# Patient Record
Sex: Female | Born: 1935 | Race: White | Hispanic: No | Smoking: Never smoker
Health system: Southern US, Community
[De-identification: ages and names within clinical notes are randomized; demographics above are authoritative.]

## PROBLEM LIST (undated history)

## (undated) DIAGNOSIS — E7800 Pure hypercholesterolemia, unspecified: Secondary | ICD-10-CM

## (undated) DIAGNOSIS — J449 Chronic obstructive pulmonary disease, unspecified: Secondary | ICD-10-CM

## (undated) DIAGNOSIS — I10 Essential (primary) hypertension: Secondary | ICD-10-CM

## (undated) DIAGNOSIS — E079 Disorder of thyroid, unspecified: Secondary | ICD-10-CM

## (undated) HISTORY — PX: DILATION AND CURETTAGE OF UTERUS: SHX78

## (undated) HISTORY — PX: ABDOMINAL HYSTERECTOMY: SHX81

## (undated) HISTORY — PX: OTHER SURGICAL HISTORY: SHX169

---

## 2001-09-08 ENCOUNTER — Ambulatory Visit (HOSPITAL_COMMUNITY): Admission: RE | Admit: 2001-09-08 | Discharge: 2001-09-08 | Payer: Self-pay | Admitting: Obstetrics and Gynecology

## 2001-09-08 ENCOUNTER — Encounter: Payer: Self-pay | Admitting: Obstetrics and Gynecology

## 2001-09-14 ENCOUNTER — Other Ambulatory Visit: Admission: RE | Admit: 2001-09-14 | Discharge: 2001-09-14 | Payer: Self-pay | Admitting: Obstetrics and Gynecology

## 2001-09-18 ENCOUNTER — Encounter: Payer: Self-pay | Admitting: Obstetrics and Gynecology

## 2001-09-18 ENCOUNTER — Ambulatory Visit (HOSPITAL_COMMUNITY): Admission: RE | Admit: 2001-09-18 | Discharge: 2001-09-18 | Payer: Self-pay | Admitting: Obstetrics and Gynecology

## 2002-11-16 ENCOUNTER — Ambulatory Visit (HOSPITAL_COMMUNITY): Admission: RE | Admit: 2002-11-16 | Discharge: 2002-11-16 | Payer: Self-pay | Admitting: Obstetrics and Gynecology

## 2002-11-16 ENCOUNTER — Encounter: Payer: Self-pay | Admitting: Obstetrics and Gynecology

## 2003-12-06 ENCOUNTER — Ambulatory Visit (HOSPITAL_COMMUNITY): Admission: RE | Admit: 2003-12-06 | Discharge: 2003-12-06 | Payer: Self-pay | Admitting: Family Medicine

## 2004-07-31 ENCOUNTER — Ambulatory Visit (HOSPITAL_COMMUNITY): Admission: RE | Admit: 2004-07-31 | Discharge: 2004-07-31 | Payer: Self-pay | Admitting: Obstetrics and Gynecology

## 2004-08-01 ENCOUNTER — Emergency Department (HOSPITAL_COMMUNITY): Admission: EM | Admit: 2004-08-01 | Discharge: 2004-08-01 | Payer: Self-pay | Admitting: Emergency Medicine

## 2004-08-06 ENCOUNTER — Ambulatory Visit (HOSPITAL_COMMUNITY): Admission: RE | Admit: 2004-08-06 | Discharge: 2004-08-06 | Payer: Self-pay | Admitting: Family Medicine

## 2006-03-06 ENCOUNTER — Ambulatory Visit (HOSPITAL_COMMUNITY): Admission: RE | Admit: 2006-03-06 | Discharge: 2006-03-06 | Payer: Self-pay | Admitting: Obstetrics and Gynecology

## 2006-09-15 ENCOUNTER — Ambulatory Visit (HOSPITAL_COMMUNITY): Admission: RE | Admit: 2006-09-15 | Discharge: 2006-09-15 | Payer: Self-pay | Admitting: Urology

## 2006-09-25 ENCOUNTER — Ambulatory Visit (HOSPITAL_COMMUNITY): Admission: RE | Admit: 2006-09-25 | Discharge: 2006-09-25 | Payer: Self-pay | Admitting: Urology

## 2006-10-09 ENCOUNTER — Ambulatory Visit (HOSPITAL_COMMUNITY): Admission: RE | Admit: 2006-10-09 | Discharge: 2006-10-09 | Payer: Self-pay | Admitting: Urology

## 2006-10-13 ENCOUNTER — Ambulatory Visit (HOSPITAL_COMMUNITY): Admission: RE | Admit: 2006-10-13 | Discharge: 2006-10-13 | Payer: Self-pay | Admitting: Urology

## 2006-10-17 ENCOUNTER — Ambulatory Visit (HOSPITAL_COMMUNITY): Admission: RE | Admit: 2006-10-17 | Discharge: 2006-10-17 | Payer: Self-pay | Admitting: Urology

## 2006-10-27 ENCOUNTER — Ambulatory Visit (HOSPITAL_COMMUNITY): Admission: RE | Admit: 2006-10-27 | Discharge: 2006-10-27 | Payer: Self-pay | Admitting: Urology

## 2006-11-22 ENCOUNTER — Emergency Department (HOSPITAL_COMMUNITY): Admission: EM | Admit: 2006-11-22 | Discharge: 2006-11-22 | Payer: Self-pay | Admitting: Emergency Medicine

## 2006-11-30 ENCOUNTER — Emergency Department (HOSPITAL_COMMUNITY): Admission: EM | Admit: 2006-11-30 | Discharge: 2006-12-01 | Payer: Self-pay | Admitting: Emergency Medicine

## 2006-12-03 ENCOUNTER — Ambulatory Visit (HOSPITAL_COMMUNITY): Admission: RE | Admit: 2006-12-03 | Discharge: 2006-12-03 | Payer: Self-pay | Admitting: Urology

## 2006-12-09 ENCOUNTER — Ambulatory Visit (HOSPITAL_COMMUNITY): Admission: RE | Admit: 2006-12-09 | Discharge: 2006-12-09 | Payer: Self-pay | Admitting: Urology

## 2007-01-05 ENCOUNTER — Inpatient Hospital Stay (HOSPITAL_COMMUNITY): Admission: RE | Admit: 2007-01-05 | Discharge: 2007-01-08 | Payer: Self-pay | Admitting: Urology

## 2007-01-05 ENCOUNTER — Encounter (INDEPENDENT_AMBULATORY_CARE_PROVIDER_SITE_OTHER): Payer: Self-pay | Admitting: Specialist

## 2007-04-07 ENCOUNTER — Ambulatory Visit (HOSPITAL_COMMUNITY): Admission: RE | Admit: 2007-04-07 | Discharge: 2007-04-07 | Payer: Self-pay | Admitting: Urology

## 2007-05-13 ENCOUNTER — Ambulatory Visit (HOSPITAL_COMMUNITY): Admission: RE | Admit: 2007-05-13 | Discharge: 2007-05-13 | Payer: Self-pay | Admitting: Obstetrics and Gynecology

## 2008-02-12 ENCOUNTER — Ambulatory Visit (HOSPITAL_COMMUNITY): Admission: RE | Admit: 2008-02-12 | Discharge: 2008-02-12 | Payer: Self-pay | Admitting: Urology

## 2008-05-30 ENCOUNTER — Ambulatory Visit (HOSPITAL_COMMUNITY): Admission: RE | Admit: 2008-05-30 | Discharge: 2008-05-30 | Payer: Self-pay | Admitting: Obstetrics and Gynecology

## 2008-09-05 ENCOUNTER — Ambulatory Visit (HOSPITAL_COMMUNITY): Admission: RE | Admit: 2008-09-05 | Discharge: 2008-09-05 | Payer: Self-pay | Admitting: Urology

## 2008-09-15 ENCOUNTER — Ambulatory Visit (HOSPITAL_COMMUNITY): Admission: RE | Admit: 2008-09-15 | Discharge: 2008-09-15 | Payer: Self-pay | Admitting: Urology

## 2009-02-03 ENCOUNTER — Ambulatory Visit (HOSPITAL_COMMUNITY): Admission: RE | Admit: 2009-02-03 | Discharge: 2009-02-03 | Payer: Self-pay | Admitting: Urology

## 2009-07-03 ENCOUNTER — Ambulatory Visit (HOSPITAL_COMMUNITY): Admission: RE | Admit: 2009-07-03 | Discharge: 2009-07-03 | Payer: Self-pay | Admitting: Obstetrics and Gynecology

## 2010-02-02 ENCOUNTER — Ambulatory Visit (HOSPITAL_COMMUNITY): Admission: RE | Admit: 2010-02-02 | Discharge: 2010-02-02 | Payer: Self-pay | Admitting: Urology

## 2010-07-09 ENCOUNTER — Ambulatory Visit (HOSPITAL_COMMUNITY): Admission: RE | Admit: 2010-07-09 | Discharge: 2010-07-09 | Payer: Self-pay | Admitting: Obstetrics and Gynecology

## 2010-10-21 ENCOUNTER — Encounter: Payer: Self-pay | Admitting: Urology

## 2011-01-01 ENCOUNTER — Other Ambulatory Visit (HOSPITAL_COMMUNITY): Payer: Self-pay | Admitting: Internal Medicine

## 2011-01-01 DIAGNOSIS — K8020 Calculus of gallbladder without cholecystitis without obstruction: Secondary | ICD-10-CM

## 2011-01-01 DIAGNOSIS — R1011 Right upper quadrant pain: Secondary | ICD-10-CM

## 2011-01-03 ENCOUNTER — Ambulatory Visit (HOSPITAL_COMMUNITY)
Admission: RE | Admit: 2011-01-03 | Discharge: 2011-01-03 | Disposition: A | Discharge status: Home / Self Care | Payer: Medicare Other | Source: Ambulatory Visit | Attending: Internal Medicine | Admitting: Internal Medicine

## 2011-01-03 DIAGNOSIS — K838 Other specified diseases of biliary tract: Secondary | ICD-10-CM | POA: Insufficient documentation

## 2011-01-03 DIAGNOSIS — K8020 Calculus of gallbladder without cholecystitis without obstruction: Secondary | ICD-10-CM

## 2011-01-03 DIAGNOSIS — R1011 Right upper quadrant pain: Secondary | ICD-10-CM

## 2011-01-04 ENCOUNTER — Ambulatory Visit (HOSPITAL_COMMUNITY): Payer: Medicare Other

## 2011-01-15 ENCOUNTER — Other Ambulatory Visit (HOSPITAL_COMMUNITY): Payer: Self-pay | Admitting: Internal Medicine

## 2011-01-15 ENCOUNTER — Ambulatory Visit (HOSPITAL_COMMUNITY)
Admission: RE | Admit: 2011-01-15 | Discharge: 2011-01-15 | Disposition: A | Discharge status: Home / Self Care | Payer: Medicare Other | Source: Ambulatory Visit | Attending: Internal Medicine | Admitting: Internal Medicine

## 2011-01-15 DIAGNOSIS — R0781 Pleurodynia: Secondary | ICD-10-CM

## 2011-01-15 DIAGNOSIS — R05 Cough: Secondary | ICD-10-CM

## 2011-01-15 DIAGNOSIS — R61 Generalized hyperhidrosis: Secondary | ICD-10-CM

## 2011-01-15 DIAGNOSIS — R059 Cough, unspecified: Secondary | ICD-10-CM | POA: Insufficient documentation

## 2011-01-15 DIAGNOSIS — R0789 Other chest pain: Secondary | ICD-10-CM | POA: Insufficient documentation

## 2011-01-18 ENCOUNTER — Other Ambulatory Visit (HOSPITAL_COMMUNITY): Payer: Self-pay | Admitting: Internal Medicine

## 2011-01-22 ENCOUNTER — Ambulatory Visit (HOSPITAL_COMMUNITY): Admission: RE | Admit: 2011-01-22 | Payer: Medicare Other | Source: Ambulatory Visit

## 2011-01-24 ENCOUNTER — Ambulatory Visit (HOSPITAL_COMMUNITY)
Admission: RE | Admit: 2011-01-24 | Discharge: 2011-01-24 | Disposition: A | Discharge status: Home / Self Care | Payer: Medicare Other | Source: Ambulatory Visit | Attending: Internal Medicine | Admitting: Internal Medicine

## 2011-01-24 ENCOUNTER — Encounter (HOSPITAL_COMMUNITY): Payer: Self-pay

## 2011-01-24 DIAGNOSIS — R0789 Other chest pain: Secondary | ICD-10-CM | POA: Insufficient documentation

## 2011-01-24 DIAGNOSIS — R918 Other nonspecific abnormal finding of lung field: Secondary | ICD-10-CM | POA: Insufficient documentation

## 2011-01-24 DIAGNOSIS — I10 Essential (primary) hypertension: Secondary | ICD-10-CM | POA: Insufficient documentation

## 2011-01-24 DIAGNOSIS — R059 Cough, unspecified: Secondary | ICD-10-CM | POA: Insufficient documentation

## 2011-01-24 DIAGNOSIS — R05 Cough: Secondary | ICD-10-CM | POA: Insufficient documentation

## 2011-01-24 HISTORY — DX: Essential (primary) hypertension: I10

## 2011-02-15 NOTE — Discharge Summary (Signed)
[name], FAIR            ACCOUNT NO.:  [id number]   MEDICAL RECORD NO.:  [id number]          PATIENT TYPE:  INP   LOCATION:  1442                         FACILITY:  [facility]   PHYSICIAN:  Heloise Purpura, MD      DATE OF BIRTH:  11-17-1930   DATE OF ADMISSION:  01/05/2007  DATE OF DISCHARGE:  01/08/2007                               DISCHARGE SUMMARY   ADMISSION DIAGNOSIS:  Right ureteropelvic junction obstruction.   DISCHARGE DIAGNOSIS:  Right ureteropelvic junction obstruction.   PROCEDURES:  1. Cystoscopy.  2. Right ureteral stent placement.  3. Right robotic assisted laparoscopic dismembered pyeloplasty.   HISTORY AND PHYSICAL:  For full details, please see admission history  and physical.  Briefly, [name] is a 77 year old female who was  found to have right renal calculi and a right ureteropelvic junction  obstruction.  She was found to have preserved renal function and after  discussing management options for this problem elected to proceed with  the above procedure.   HOSPITAL COURSE:  On 01/05/2007, the patient was taken to the operating  room and underwent the aforementioned procedures.  She tolerated her  surgery well and without complications.  Postoperatively, she was able  to be transferred to a regular hospital room following recovery from  anesthesia.  She was closely monitored and remained hemodynamically  stable.  Her hemoglobin did drop to 11.0 on postoperative day #1 which  was most likely due to IV fluid hydration and hemodilution.  Her  hemoglobin was rechecked and was stable at 10.6.  She was able to begin  a clear liquid diet on postoperative day #1 was able to begin ambulating  which she did without difficulty.  She was gradually transitioned to  oral pain medication and her diet was advanced to a regular diet by the  afternoon of postoperative day #2.  On the morning of postoperative day  #3, her drain fluid was checked for creatinine level as  the output had  remained sufficiently low.  This was found to be 0.6 and consistent with  serum.  Drain was therefore removed and she was able to be discharged  home in excellent condition.   DISPOSITION:  Home.   DISCHARGE MEDICATIONS:  The patient was instructed to use Darvocet as  needed for pain and to use Colace as a stool softener.  She was given  instructions also to begin doxycycline shortly following discharge as  her urine culture from her nephrostomy tube did return and was sensitive  to this antibiotic therapy.   DISCHARGE INSTRUCTIONS:  The patient was instructed to be ambulatory but  specifically told to refrain from any heavy lifting, strenuous activity,  or driving.   FOLLOW UP:  The patient was scheduled to follow-up in 2 weeks for  further evaluation and removal of her staples.           ______________________________  Heloise Purpura, MD  Electronically Signed     LB/MEDQ  D:  01/08/2007  T:  01/09/2007  Job:  [id number]

## 2011-02-15 NOTE — Op Note (Signed)
[name], [name]            ACCOUNT NO.:  [id number]   MEDICAL RECORD NO.:  [id number]          PATIENT TYPE:  AMB   LOCATION:  DAY                           FACILITY:  [facility]   PHYSICIAN:  Ky Barban, M.D.DATE OF BIRTH:  May 20, 1931   DATE OF PROCEDURE:  10/17/2006  DATE OF DISCHARGE:                               OPERATIVE REPORT   PREOPERATIVE DIAGNOSIS:  Right staghorn calculus.   POSTOPERATIVE DIAGNOSIS:  Right staghorn calculus.   PROCEDURE:  The patient is under general endotracheal anesthesia in  prone position.  A nephrostogram is done and she has a rather very large  renal pelvis.  I did not see the stone inside the pelvis because higher  up, but this part of the pelvis was not communicating with the upper  pole and the middle pole.  Decided to look into this area and the  patient already has a nephrostomy tube and a guidewire is passed into  the nephrostomy tube and the guidewire was coiled up into the renal  pelvis.  Over the guidewire a balloon dilator was inserted and the  nephrostomy track is dilated.  After waiting 4 minutes, I introduced the  #30 __________  tube over the balloon.  It is positioned within the  renal pelvis and the skin.  The tube is coming a about a couple of  inches outside and this __________  tube once it is in place, the  balloon is removed.  I already had a second safety guidewire which was  stitched to the skin.  Introduced a nephroscope and looked into the huge  cavity in the renal pelvis.  I do not see any staghorn stone in this  area.  Under fluoroscopy I can see the stone is higher up.  I cannot get  to that area through this nephrostomy track, and there was one stone  floating around, which I removed.  At this point I decided that I would  __________  this procedure as the patient needs to have a proper  placement of the nephrostomy tube and during this process, I was able to  locate the UPJ and the guidewire was passed  toward the bladder but I  decided not to do endopyelotomy at this stage.  So the guidewire was  removed and through the nephrostomy __________  tube I was able to put a  #14 Foley catheter.  The balloon is inflated with 5 mL, it is draining  fine, and at this point the __________  tube was cut and removed from  around the Foley catheter.  The guidewire was removed.  The patient is  not having any bleeding.  She did not lose any blood during the  procedure, maybe 5 or 10 mL whole amount, and skin was closed, which I  had opened before I opened the balloon, so it was closed with one stitch  of silk.  A sterile dressing applied.  The patient left the operating  room in satisfactory condition.  I want to mention that this Foley  catheter, I have cut the tip in case we need to get into  the renal  pelvis, the guidewire can be introduced through that.  The patient left  the operating room in satisfactory condition.      Ky Barban, M.D.  Electronically Signed     MIJ/MEDQ  D:  10/17/2006  T:  10/17/2006  Job:  [id number]

## 2011-02-15 NOTE — Op Note (Signed)
[name], Cabrera            ACCOUNT NO.:  [id number]   MEDICAL RECORD NO.:  [id number]          PATIENT TYPE:  AMB   LOCATION:  DAY                          [facility]ACILITY:  [facility]   PHYSICIAN:  Heloise Purpura, MD      DATE OF BIRTH:  1931-06-11   DATE OF PROCEDURE:  12/09/2006  DATE OF DISCHARGE:  12/09/2006                               OPERATIVE REPORT   PREOPERATIVE DIAGNOSES:  1. Right ureteropelvic junction obstruction.  2. Hematuria.   POSTOPERATIVE DIAGNOSES:  1. Right ureteropelvic junction obstruction.  2. Hematuria.   PROCEDURES.:  1. Cystoscopy.  2. Bilateral retrograde pyelography.   SURGEON:  Dr. Heloise Purpura.   ANESTHESIA:  General.   COMPLICATIONS:  None.   ESTIMATED BLOOD LOSS:  Minimal.   INDICATION:  [name] is a 77 year old female who initially presented  with hematuria and right-sided flank pain.  She was found to  subsequently have a large right renal calculus and underwent a right  percutaneous nephrolithotomy.  At the time of her procedure, she was  felt to possibly have a ureteropelvic junction obstruction.  She was  subsequently evaluated and did indeed appear to have a significant  obstruction at the level of the ureteropelvic junction.  She was sent  for further evaluation and consideration of a robotic pyeloplasty.  She  is taken to the operating room today to complete her hematuria  evaluation and to further define her upper urinary tract anatomy as no  contrast was seen extending below the renal pelvis on prior  nephrostogram.  The potential risks and benefits were discussed with the  patient and she consented.   DESCRIPTION OF PROCEDURE:  The patient was taken to the operating room  and a general anesthetic was administered.  She was given preoperative  antibiotics, placed in the dorsal lithotomy position, prepped and draped  in the usual sterile fashion.  Next a preoperative time-out was  performed.  Cystourethroscopy was then  performed.  The ureteral orifices  were noted in the normal anatomic position and effluxing clear urine on  the left side and no reflux from the right side.  The bladder was then  examined systematically and there was no evidence of any bladder tumors,  stones, or other mucosal pathology. The left ureteral orifice was then  intubated with a 6-French ureteral catheter, contrast was injected.  This demonstrated a normal caliber ureter and renal pelvis without  evidence of filling defects.  An identical procedure was then performed  on the contralateral side.  This demonstrated a normal caliber ureter  without evidence of filling defects.  The renal pelvis also appeared  without filling defects.  Contrast was easily injected the level of the  ureteropelvic junction into the renal pelvis.  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  The nephrostomy tube was then clamped and a  retrograde pyelogram was performed again.  This demonstrated findings  consistent with a dilated renal pelvis as per the patient's initial  nephrostogram.  At this point the patient's bladder was emptied.  She  was then able to be awakened and transferred to the recovery unit in  satisfactory condition.           ______________________________  Heloise Purpura, MD  Electronically Signed     LB/MEDQ  D:  12/09/2006  T:  12/11/2006  Job:  [id number]

## 2011-02-15 NOTE — Op Note (Signed)
NAMEGABRELLE, ROCA            ACCOUNT NO.:  [id number]   MEDICAL RECORD NO.:  [id number]          PATIENT TYPE:  INP   LOCATION:  X003                         FACILITY:  [facility]   PHYSICIAN:  Heloise Purpura, MD      DATE OF BIRTH:  10-28-1930   DATE OF PROCEDURE:  01/05/2007  DATE OF DISCHARGE:                               OPERATIVE REPORT   PREOPERATIVE DIAGNOSIS:  Right ureteropelvic junction obstruction.   POSTOPERATIVE DIAGNOSIS:  Right ureteropelvic junction obstruction.   OPERATION/PROCEDURE:  1. Cystoscopy.  2. Right retrograde pyelography.  3. Right ureteral stent placement.  4. Right robotic assisted laparoscopic dismembered pyeloplasty.   SURGEON:  Crecencio Mc, M.D.   ASSISTANT:  Terie Purser, M.D.   COMPLICATIONS:  None.   ANESTHESIA:  General.   ESTIMATED BLOOD LOSS:  Less than 100 mL.   IV FLUIDS:  2000 mL of lactated Ringer's.   SPECIMEN:  Right ureteropelvic junction.   DISPOSITION:  Specimen to pathology.   INDICATIONS:  Mrs. Bahri is a 77 year old female who was found to have  right renal calculi and findings consistent with a UPJ obstruction.  She  underwent a percutaneous nephrolithotomy by an outside urologist, Dr.  Jerre Simon.  After discussion with the family, she elected to proceed with a  dismembered pyeloplasty.  She did undergo further evaluation including a  retrograde pyelogram which was consistent with her diagnosis and a  nuclear medicine renal scan which demonstrated symmetric renal function.  Potential risks and benefits were discussed with the patient and she  consented.   DESCRIPTION OF PROCEDURE:  The patient was taken to the operating room  and a general anesthetic was administered.  She was given preoperative  antibiotics, placed in the dorsal lithotomy position, prepped and draped  in the usual sterile fashion.  A preoperative time-out was performed.  Cystourethroscopy was then performed.  The bladder was systematically  inspected.  There was no evidence of any bladder tumors, stones, or  other mucosal pathology.  The ureteral orifices were in the normal  anatomic position and effluxing clear urine.  Attention then turned the  right ureteral orifice which was intubated with a 6-French ureteral  catheter.  Contrast was then injected which again demonstrated a  narrowing at the level of the ureteropelvic junction.  There was noted  to be an indwelling nephrostomy tube in good position.  A 0.038 sensor  guidewire was then advanced through the ureteral catheter and up into  the renal pelvis without difficulty.  An 8 x 28 double-J contour  ureteral stent was then advanced over the wire with a good curl noted in  the renal pelvis as well as in the bladder.  The nephrostomy tube was  then removed under fluoroscopy.  The stent remained in good position.  A  16-French Foley catheter was then placed.   The patient was then repositioned in the right modified flank position  with care to pad all potential pressure points.  Her abdomen was then  prepped and draped in usual sterile fashion.  A site was selected in the  midline just superior to the umbilicus  for placement of the camera port.  This was placed using a standard open Hassan technique.  This allowed  placement of a 12 mm port.  A pneumoperitoneum was then established.  The abdomen was inspected.  There was no evidence of any intra-abdominal  injuries.  There was noted to be extensive adhesions in the lower  midline as well as some minor adhesions in the right lower quadrant.  An  8 mm robotic port was placed in the right upper quadrant just lateral to  the falciform ligament.  An additional 8 mm robotic port was placed in  the right lower quadrant away from the previously mentioned adhesions.  An additional 8 mm robotic port was placed further inferior and lateral  in the right lower quadrant for the fourth arm.  Due to the extensive  adhesions in the lower  midline, it was decided to place the 12 mm  assistance port in the midline superior to the camera port.  This was  placed without difficulty as were all other ports as well as under  direct vision.  The surgical cart was then docked.  With the aid of the  cautery scissors, the white line of Toldt was incised along the length  of the ascending colon, allowing the colonic mesentery to be mobilized  medially and the space between it and the anterior layer of Gerota's  fascia to be developed.  The patient was noted to have an extremely  floppy liver which was retracted with the fourth arm.  The gonadal vein  and ureter were identified.  The ureter was then lifted anteriorly off  the psoas muscle and followed up toward the renal pelvis.  At first, it  was thought that there may be crossing lower pole renal vessels.  However, this simply appeared to be a prominent gonadal vein and gonadal  artery which were extending parallel but crossing anterior to the  ureteropelvic junction.  This did not appear to be the pathology or  cause of the UPJ obstruction.  The renal pelvis was then dissected free  from the surrounding structures superior to the gonadal vessels.  There  was noted to be a narrowed UPJ segment.  Once this was properly freed,  the UPJ was incised and was removed and sent for permanent pathologic  analysis.  In addition, the medial portion of the pelvis was excised as  it did appear to be redundant.  The medial aspect of the pelvis was also  spatulated appropriately and then closed with a running 4-0 Vicryl  suture.  The ureter was spatulated laterally and 4-0 Vicryl holding  stitches were then placed at the medial and lateral aspects of the  anastomosis to reapproximate these structures.  A 4-0 Vicryl running  suture was then used to close the posterior aspect of the anastomosis in  a running fashion.  The ureteral stent was then placed back into the renal pelvis and the anterior  portion of the anastomosis was closed with  a running 4-0 Vicryl suture.  A #15 Blake drain was then brought in  through the fourth arm port and appropriately positioned in the  perinephric space.  The surgical cart was then undocked.   The 12 mm port sites were closed with 0 Vicryl sutures which were placed  with a suture passer device.  An additional 0 Vicryl figure-of-eight  suture was used to close the original camera port site.  All remaining  ports were removed under direct vision and  a pneumoperitoneum was  expelled.  The patient appeared to tolerate the procedure well without  complications.  She was able to be awakened and transferred to recovery  unit in satisfactory condition.           ______________________________  Heloise Purpura, MD  Electronically Signed     LB/MEDQ  D:  01/05/2007  T:  01/05/2007  Job:  [id number]

## 2011-02-15 NOTE — H&P (Signed)
NAME:  [name], [name]            ACCOUNT NO.:  [id number]   MEDICAL RECORD NO.:  [id number]          PATIENT TYPE:  AMB   LOCATION:  DAY                           FACILITY:  [facility]   PHYSICIAN:  Ky Barban, M.D.DATE OF BIRTH:  26-Sep-1931   DATE OF ADMISSION:  10/17/2006  DATE OF DISCHARGE:  LH                              HISTORY & PHYSICAL   This patient is coming tomorrow to have her surgery at [facility].   CHIEF COMPLAINT:  Recurrent urinary tract infection.   HISTORY OF PRESENT ILLNESS:  This 77 year old female was referred to me  by Dr. Emelda Fear.  She has recurrent urinary tract infection with Proteus  with positive urine culture and has some nagging pain in the right  flank.  A CT scan was done which shows massive right hydronephrosis with  large staghorn calculus.  So, I advised her to undergo percutaneous  nephrolithotripsy and she may follow it up with the remaining stone with  ESL.  She also has questionable UPJ obstruction and I told her that once  the stone is out we will evaluate the UPJ and see if she needs any  surgery (that will be another separate surgery for that problem).  She  is otherwise in good health.   PAST MEDICAL HISTORY:  She had a kidney stone 20 years ago and  apparently had stone basket done in [place].  She had hysterectomy with  bilateral salpingo-oophorectomy for fibroid.  No diabetes or  hypertension.   MEDICATIONS:  Premarin cream.   PERSONAL HISTORY:  Does not smoke or drink.   REVIEW OF SYSTEMS:  Denies any chest pain, orthopnea, PND, nausea,  vomiting.   PHYSICAL EXAMINATION:  VITAL SIGNS:  Blood pressure 148/80, temperature  is 97.7.  CENTRAL NERVOUS SYSTEM:  No gross neurological deficits.  HEAD/NECK/EYE/ENT:  Negative.  CHEST:  Symmetrical.  HEART:  Regular sinus rhythm.  ABDOMEN:  Soft, flat, liver/spleen/kidneys are not palpable.  No CVA  tenderness.  PELVIC:  No adnexal mass or tenderness.    IMPRESSION:  1. Recurrent urinary tract infections.  2. Staghorn calculus right kidney.  3. Possible right ureteropelvic obstruction.   The patient had percutaneous nephrostomy already done this week in  [place] and it is draining fine.  Tomorrow when she comes in the  hospital to have percutaneous nephrolithotripsy done, I will give her  preoperatively antibiotics then do the procedure and __________ she will  stay overnight in the hospital.  I have discussed complications of this  procedure in very great detail with the patient and her daughter.  I  told her that there is a possibility of hemorrhage and she can lose that  kidney, I can convert this procedure into open procedure, I may stop  this procedure if I encounter some bleeding.  She may need additional  procedure if there is additional stone which __________  tomorrow.  She  may need ESL and once the stone is out then we will talk about taking  care of the UPJ obstruction.  Until that time she will get a nephrostomy  tube.  Ky Barban, M.D.  Electronically Signed     MIJ/MEDQ  D:  10/16/2006  T:  10/16/2006  Job:  [id number]   cc:   [facility]  Fax: [phone]   Tilda Burrow, M.D.  Fax: [phone]

## 2011-02-15 NOTE — H&P (Signed)
[name], [name]            ACCOUNT NO.:  [id number]   MEDICAL RECORD NO.:  [id number]          PATIENT TYPE:  INP   LOCATION:  X003                         FACILITY:  [facility]   PHYSICIAN:  Lauren Purpura, MD      DATE OF BIRTH:  1930/11/22   DATE OF ADMISSION:  01/05/2007  DATE OF DISCHARGE:                              HISTORY & PHYSICAL   CHIEF COMPLAINT:  Right ureteropelvic junction obstruction.   HISTORY:  [name] is a 77 year old female who recently presented in  [facility] with right flank pain.  She was evaluated by Dr.  Jerre Cabrera and was found to have right renal calculi with what appeared to  be a right ureteropelvic junction obstruction.  She underwent  percutaneous removal of her calculi and was sent for further evaluation  for management of her UPJ obstruction.  She did undergo a nuclear  medicine renal scan which demonstrated symmetric renal function.  She  also underwent a retrograde pyelogram evaluation which demonstrated an  anatomic configuration consistent with a UPJ obstruction and no other  obvious abnormalities.  She has been managed with an indwelling  nephrostomy tube since that time.  After discussing management options  for treatment of her UPJ obstruction, the patient elected to proceed  with a dismembered pyeloplasty.  We discussed different surgical  approaches, and she did wish to proceed with a minimally invasive  approach.   PAST MEDICAL HISTORY:  1. Hyperlipidemia.  2. Gastroesophageal reflux disease.   PAST SURGICAL HISTORY:  1. Ureteroscopy  2. Hysterectomy.  3. Right inguinal hernia repair.  4. Percutaneous right nephrolithotomy.   MEDICATIONS:  1. Premarin cream  2. Zantac p.r.n.  3. Tylenol p.r.n.   ALLERGIES:  NO KNOWN DRUG ALLERGIES.   FAMILY HISTORY:  No history of urolithiasis, renal failure, or GU  malignancy.   SOCIAL HISTORY:  The patient is retired.  She is married.  She denies  tobacco or alcohol use.   REVIEW OF SYSTEMS:  Pertinent positives include a history of back pain,  indigestion, and dizziness.  All other systems were reviewed and are  otherwise negative.   Urine culture negative.   </PHYSICAL EXAMINATION  CONSTITUTIONAL:  Alert and oriented and in no acute distress.  CARDIOVASCULAR:  Regular rate and rhythm, without obvious murmurs.  LUNGS:  Clear bilaterally.  ABDOMEN:  Soft, nontender, nondistended, without abdominal masses.  BACK:  The patient has an indwelling right-sided nephrostomy tube.  No  left CVA tenderness.  GU: Normal female.  EXTREMITIES:  No edema.   IMPRESSION:  Right ureteropelvic junction obstruction.   PLAN:  [name] will undergo cystoscopy with placement of a right  ureteral stent and then undergo a robotic- assisted laparoscopic  dismembered pyeloplasty.  She will then be admitted for routine  postoperative care.           ______________________________  Lauren Purpura, MD  Electronically Signed     LB/MEDQ  D:  01/05/2007  T:  01/05/2007  Job:  [id number]

## 2011-02-27 ENCOUNTER — Encounter (HOSPITAL_COMMUNITY): Payer: Medicare Other

## 2011-02-27 ENCOUNTER — Other Ambulatory Visit: Payer: Self-pay | Admitting: Ophthalmology

## 2011-02-27 LAB — BASIC METABOLIC PANEL
CO2: 28 mEq/L (ref 19–32)
Calcium: 10.7 mg/dL — ABNORMAL HIGH (ref 8.4–10.5)
Glucose, Bld: 144 mg/dL — ABNORMAL HIGH (ref 70–99)
Potassium: 3.8 mEq/L (ref 3.5–5.1)
Sodium: 139 mEq/L (ref 135–145)

## 2011-03-01 ENCOUNTER — Ambulatory Visit (HOSPITAL_COMMUNITY)
Admission: RE | Admit: 2011-03-01 | Discharge: 2011-03-01 | Disposition: A | Discharge status: Home / Self Care | Payer: Medicare Other | Source: Ambulatory Visit | Attending: Internal Medicine | Admitting: Internal Medicine

## 2011-03-01 ENCOUNTER — Other Ambulatory Visit (HOSPITAL_COMMUNITY): Payer: Self-pay | Admitting: Internal Medicine

## 2011-03-01 DIAGNOSIS — J189 Pneumonia, unspecified organism: Secondary | ICD-10-CM

## 2011-03-07 ENCOUNTER — Ambulatory Visit (HOSPITAL_COMMUNITY)
Admission: RE | Admit: 2011-03-07 | Discharge: 2011-03-07 | Disposition: A | Discharge status: Home / Self Care | Payer: Medicare Other | Source: Ambulatory Visit | Attending: Ophthalmology | Admitting: Ophthalmology

## 2011-03-07 DIAGNOSIS — Z79899 Other long term (current) drug therapy: Secondary | ICD-10-CM | POA: Insufficient documentation

## 2011-03-07 DIAGNOSIS — I10 Essential (primary) hypertension: Secondary | ICD-10-CM | POA: Insufficient documentation

## 2011-03-07 DIAGNOSIS — J449 Chronic obstructive pulmonary disease, unspecified: Secondary | ICD-10-CM | POA: Insufficient documentation

## 2011-03-07 DIAGNOSIS — H268 Other specified cataract: Secondary | ICD-10-CM | POA: Insufficient documentation

## 2011-03-07 DIAGNOSIS — H2510 Age-related nuclear cataract, unspecified eye: Secondary | ICD-10-CM | POA: Insufficient documentation

## 2011-03-07 DIAGNOSIS — J4489 Other specified chronic obstructive pulmonary disease: Secondary | ICD-10-CM | POA: Insufficient documentation

## 2011-03-11 NOTE — Op Note (Signed)
  NAMEBRELEE, [name]            ACCOUNT NO.:  [id number]  MEDICAL RECORD NO.:  [id number]  LOCATION:  DAYP                          FACILITY:  [facility]  PHYSICIAN:  Susanne Greenhouse, MD       DATE OF BIRTH:  1931-05-26  DATE OF PROCEDURE:  03/07/2011 DATE OF DISCHARGE:  03/07/2011                              OPERATIVE REPORT   PREOPERATIVE DIAGNOSES: 1. Nuclear cataract, left eye, diagnosis code 366.16. 2. 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  POSTOPERATIVE DIAGNOSES: 1. Nuclear cataract, left eye, diagnosis code 366.16. 2. 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  SURGEON:  Susanne Greenhouse, MD.  ANESTHESIA:  Topical with monitored anesthesia care.  DESCRIPTION OF OPERATION:  In the preoperative holding area, dilating drops and viscous lidocaine were placed into the left eye.  The patient was then brought to the operating room where she was prepped and draped. Beginning with a 15-blade, a paracentesis port was made at the surgeon's 2 o'clock position.  Anterior chamber was then filled with 1% nonpreserved lidocaine solution.  This was followed by instillation of vision blue into the anterior chamber.  The vision blue was displaced with balanced salt solution.  The anterior chamber was then filled with Viscoat.  A 2.4 mm keratome blade was then used to make a clear corneal incision at the temporal limbus.  A bent cystotome needle was used to create a continuous tear capsulotomy.  Hydrodissection was performed with balanced salt solution on a fine cannula.  The lens nucleus was then removed using phacoemulsification in a quadrant cracking technique. Residual cortex was removed with irrigation and aspiration.  The capsular bag and anterior chamber were then refilled with Provisc.  The posterior chamber interocular lens was placed into the capsular bag without difficulty using a lens injecting system.  The Provisc was then removed from the capsular bag and  anterior chamber with irrigation and aspiration.  Stromal hydration was performed with balanced salt solution on a fine cannula of the main incision and the paracentesis port.  The patient tolerated the procedure well.  There were no operative complications.  She was returned to recovery area in satisfactory condition.  PROSTHETIC DEVICE USED:  Lenstec posterior chamber lens, model Softec HD, power of 21.5, serial number is [id number].          ______________________________ Susanne Greenhouse, MD     KEH/MEDQ  D:  03/07/2011  T:  03/07/2011  Job:  [id number]  Electronically Signed by Gemma Payor MD on 03/11/2011 12:23:02 PM

## 2011-03-20 ENCOUNTER — Other Ambulatory Visit (HOSPITAL_COMMUNITY): Payer: Medicare Other

## 2011-03-25 ENCOUNTER — Ambulatory Visit (HOSPITAL_COMMUNITY)
Admission: RE | Admit: 2011-03-25 | Discharge: 2011-03-25 | Disposition: A | Discharge status: Home / Self Care | Payer: Medicare Other | Source: Ambulatory Visit | Attending: Ophthalmology | Admitting: Ophthalmology

## 2011-03-25 DIAGNOSIS — H2510 Age-related nuclear cataract, unspecified eye: Secondary | ICD-10-CM | POA: Insufficient documentation

## 2011-03-25 DIAGNOSIS — I10 Essential (primary) hypertension: Secondary | ICD-10-CM | POA: Insufficient documentation

## 2011-03-25 DIAGNOSIS — Z79899 Other long term (current) drug therapy: Secondary | ICD-10-CM | POA: Insufficient documentation

## 2011-04-08 NOTE — Op Note (Signed)
  NAMEBERYL, [name]            ACCOUNT NO.:  [id number]  MEDICAL RECORD NO.:  [id number]  LOCATION:  DAYP                          FACILITY:  [facility]  PHYSICIAN:  Susanne Greenhouse, MD       DATE OF BIRTH:  1931-03-05  DATE OF PROCEDURE:  03/25/2011 DATE OF DISCHARGE:                              OPERATIVE REPORT   PREOPERATIVE DIAGNOSIS:  Nuclear cataract, right eye, diagnosis code 366.16.  POSTOPERATIVE DIAGNOSIS:  Nuclear cataract, right eye, diagnosis code 366.16.  Phacoemulsification with posterior chamber intraocular lens implantation, right eye.  SURGEON:  Susanne Greenhouse, MD  ANESTHESIA:  General endotracheal anesthesia.  OPERATIVE SUMMARY:  In the preoperative area, dilating drops were placed into the right eye.  The patient was then brought into the operating room where she was placed under general anesthesia.  The eye was then prepped and draped.  Beginning with a 75 blade, a paracentesis port was made at the surgeon's 2 o'clock position.  The anterior chamber was then filled with a 1% nonpreserved lidocaine solution with epinephrine.  This was followed by Viscoat to deepen the chamber.  A small fornix-based peritomy was performed superiorly.  Next, a single iris hook was placed through the limbus superiorly.  A 2.4-mm keratome blade was then used to make a clear corneal incision over the iris hook.  A bent cystotome needle and Utrata forceps were used to create a continuous tear capsulotomy.  Hydrodissection was performed using balanced salt solution on a fine cannula.  The lens nucleus was then removed using phacoemulsification in a quadrant cracking technique.  The cortical material was then removed with irrigation and aspiration.  The capsular bag and anterior chamber were refilled with Provisc.  The wound was widened to approximately 3 mm and a posterior chamber intraocular lens was placed into the capsular bag without difficulty using an [employer] lens  injecting system.  A single 10-0 nylon suture was then used to close the incision as well as stromal hydration.  The Provisc was removed from the anterior chamber and capsular bag with irrigation and aspiration.  At this point, the wounds were tested for leak, which were negative.  The anterior chamber remained deep and stable.  The patient tolerated the procedure well.  There were no operative complications, and she awoke from general anesthesia without problem.  No surgical specimens.  Prosthetic device used is a Lenstec posterior chamber lens model Softec HD, power of 21.75, serial number is [id number].          ______________________________ Susanne Greenhouse, MD     KEH/MEDQ  D:  03/25/2011  T:  03/26/2011  Job:  [id number]  Electronically Signed by Gemma Payor MD on 04/08/2011 12:40:29 PM

## 2011-06-25 ENCOUNTER — Ambulatory Visit (HOSPITAL_COMMUNITY)
Admission: RE | Admit: 2011-06-25 | Discharge: 2011-06-25 | Disposition: A | Discharge status: Home / Self Care | Payer: Medicare Other | Source: Ambulatory Visit | Attending: Internal Medicine | Admitting: Internal Medicine

## 2011-06-25 DIAGNOSIS — R05 Cough: Secondary | ICD-10-CM | POA: Insufficient documentation

## 2011-06-25 DIAGNOSIS — R0602 Shortness of breath: Secondary | ICD-10-CM | POA: Insufficient documentation

## 2011-06-25 DIAGNOSIS — R059 Cough, unspecified: Secondary | ICD-10-CM | POA: Insufficient documentation

## 2011-06-25 MED ORDER — ALBUTEROL SULFATE (5 MG/ML) 0.5% IN NEBU
2.5000 mg | INHALATION_SOLUTION | Freq: Once | RESPIRATORY_TRACT | Status: AC
  Administered 2011-06-25: 2.5 mg via RESPIRATORY_TRACT

## 2011-06-28 ENCOUNTER — Other Ambulatory Visit: Payer: Self-pay | Admitting: Obstetrics and Gynecology

## 2011-06-28 DIAGNOSIS — Z139 Encounter for screening, unspecified: Secondary | ICD-10-CM

## 2011-06-30 NOTE — Procedures (Signed)
NAME:  [name], [name]            ACCOUNT NO.:  [id number]  MEDICAL RECORD NO.:  [id number]  LOCATION:  RESP                          FACILITY:  [facility]  PHYSICIAN:  Draven Laine L. Juanetta Gosling, M.D.DATE OF BIRTH:  [birth year]  DATE OF PROCEDURE: DATE OF DISCHARGE:  06/25/2011                           PULMONARY FUNCTION TEST   Reason for pulmonary function testing is cough. 1. Spirometry shows a moderate ventilatory defect with evidence of     airflow obstruction most marked in the smaller airways. 2. Lung volumes show no evidence of restrictive change, but do show     air trapping. 3. DLCO is mildly reduced. 4. There is significant bronchodilator improvement. 5. This is consistent with COPD.     Milind Raether L. Juanetta Gosling, M.D.     ELH/MEDQ  D:  06/30/2011  T:  06/30/2011  Job:  [id number]

## 2011-07-16 ENCOUNTER — Ambulatory Visit (HOSPITAL_COMMUNITY)
Admission: RE | Admit: 2011-07-16 | Discharge: 2011-07-16 | Disposition: A | Discharge status: Home / Self Care | Payer: Medicare Other | Source: Ambulatory Visit | Attending: Obstetrics and Gynecology | Admitting: Obstetrics and Gynecology

## 2011-07-16 DIAGNOSIS — Z139 Encounter for screening, unspecified: Secondary | ICD-10-CM

## 2011-07-16 DIAGNOSIS — Z1231 Encounter for screening mammogram for malignant neoplasm of breast: Secondary | ICD-10-CM | POA: Insufficient documentation

## 2011-07-23 ENCOUNTER — Other Ambulatory Visit: Payer: Self-pay | Admitting: Obstetrics and Gynecology

## 2011-07-23 DIAGNOSIS — R928 Other abnormal and inconclusive findings on diagnostic imaging of breast: Secondary | ICD-10-CM

## 2011-08-21 ENCOUNTER — Ambulatory Visit (HOSPITAL_COMMUNITY)
Admission: RE | Admit: 2011-08-21 | Discharge: 2011-08-21 | Disposition: A | Discharge status: Home / Self Care | Payer: Medicare Other | Source: Ambulatory Visit | Attending: Obstetrics and Gynecology | Admitting: Obstetrics and Gynecology

## 2011-08-21 DIAGNOSIS — R928 Other abnormal and inconclusive findings on diagnostic imaging of breast: Secondary | ICD-10-CM | POA: Insufficient documentation

## 2011-11-10 IMAGING — CT CT CHEST W/O CM
2 of 3 series · 15 of 36 positions shown, 18 images · non-contrast
Comparison: Chest x-ray dated January 15, 2011

CLINICAL DATA: Mass on chest x-ray; cough and congestion, chest
pain and hypertension, chills

CT CHEST WITHOUT CONTRAST
TECHNIQUE: Multidetector CT imaging of the chest was performed
following the standard protocol without IV contrast.

[Series 2: chestroutine 5.0 b40f · axial · 0.63mm/px · z∈[-308,-52]mm · 12 of 61 slices shown, 15 images]
[im 5/61  mediastinal]
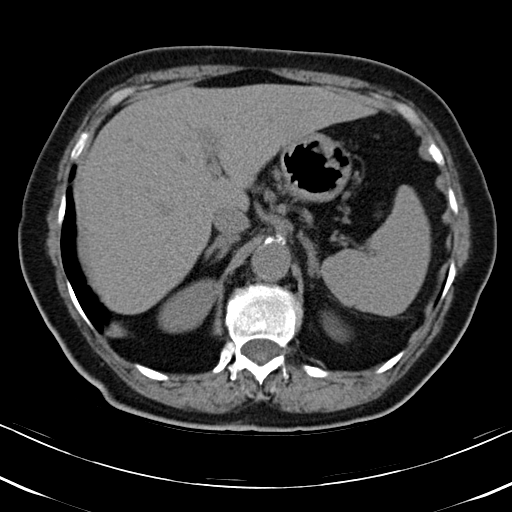
[im 5/61  lung]
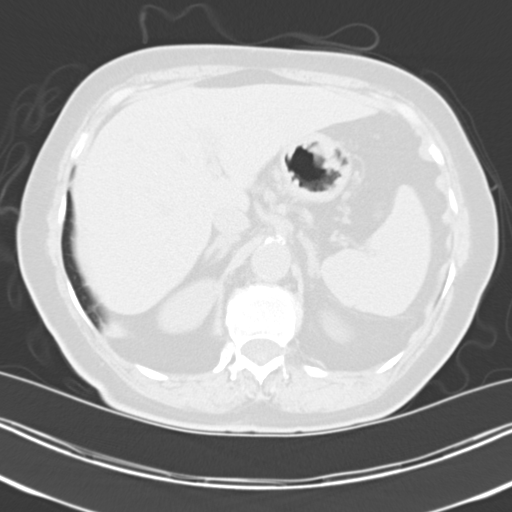
[im 9/61  lung]
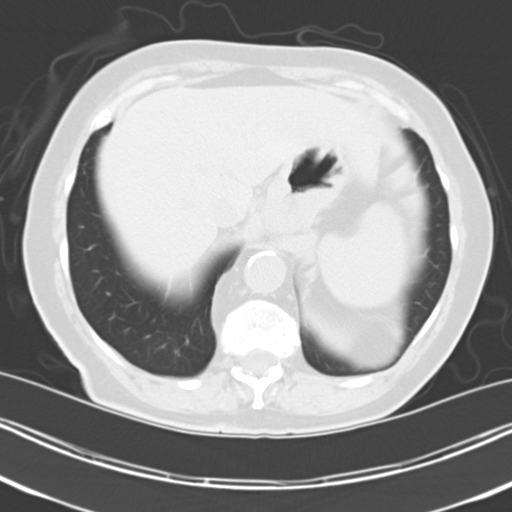
[im 14/61  lung]
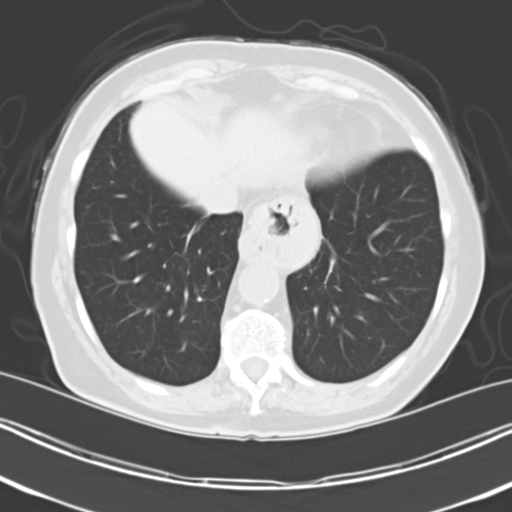
[im 18/61  lung]
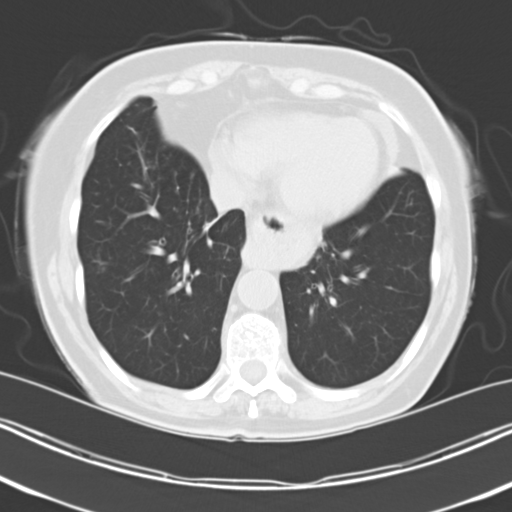
[im 23/61  mediastinal]
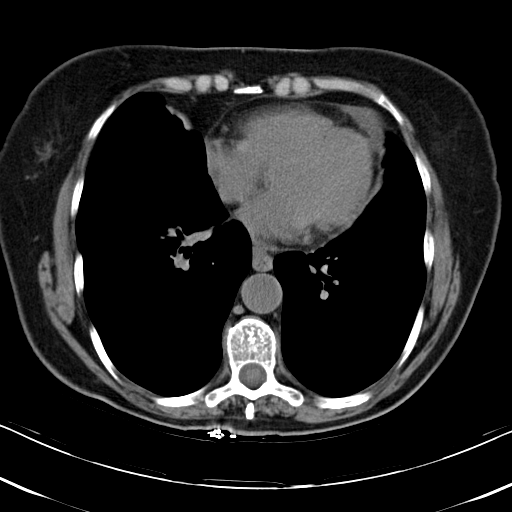
[im 23/61  lung]
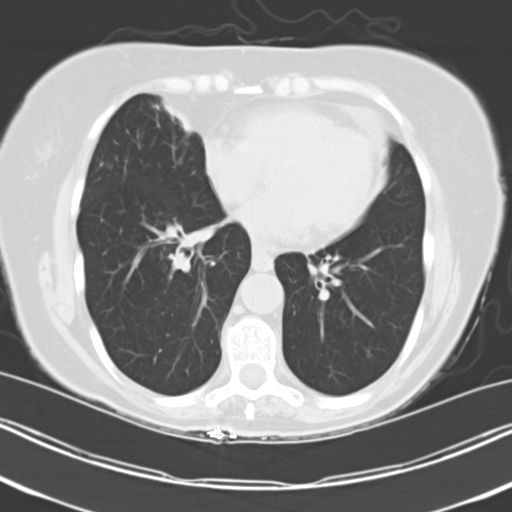
[im 27/61  lung]
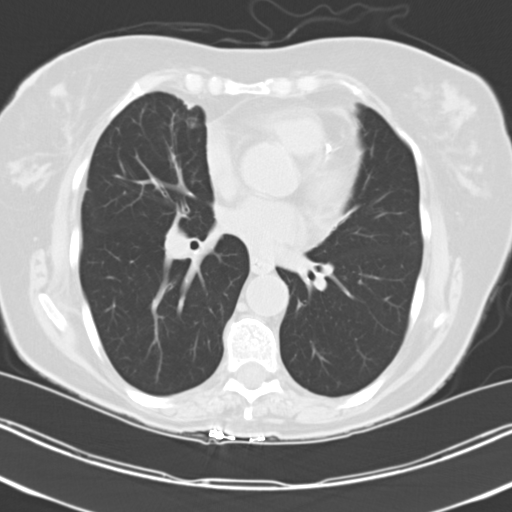
[im 34/61  lung]
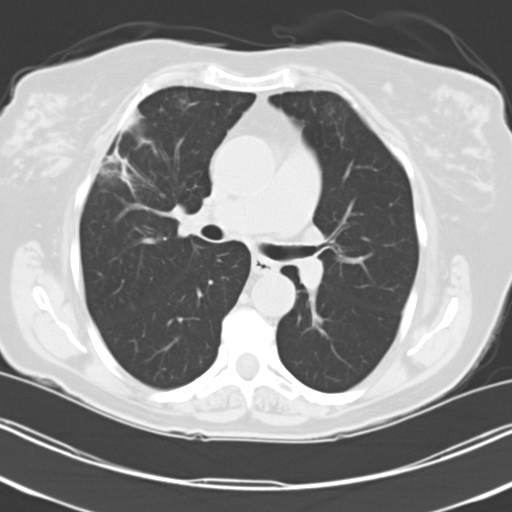
[im 38/61  lung]
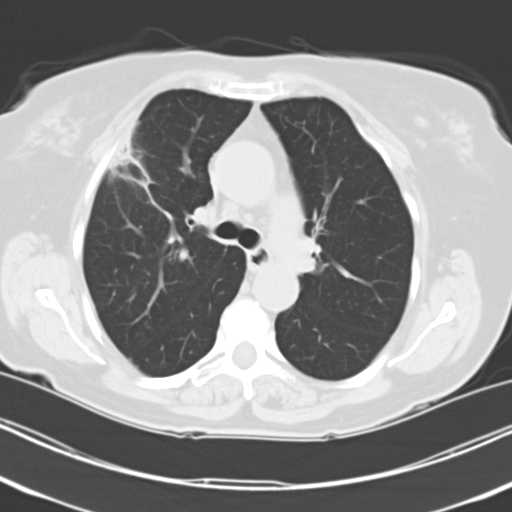
[im 43/61  mediastinal]
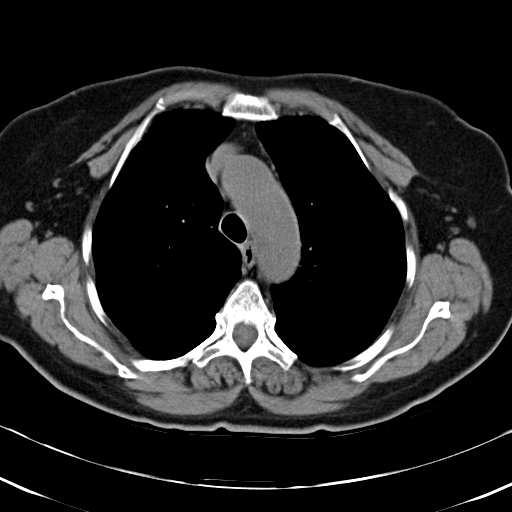
[im 43/61  lung]
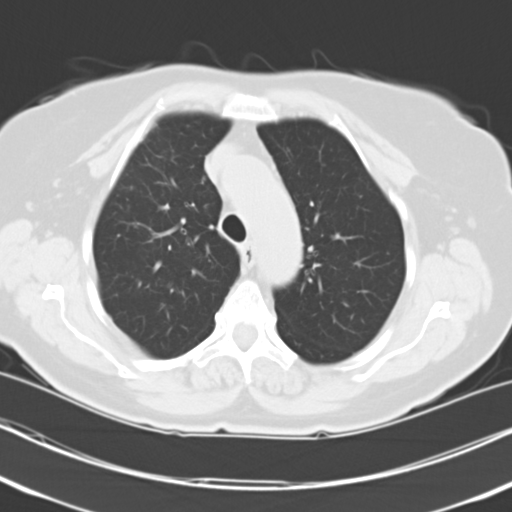
[im 47/61  lung]
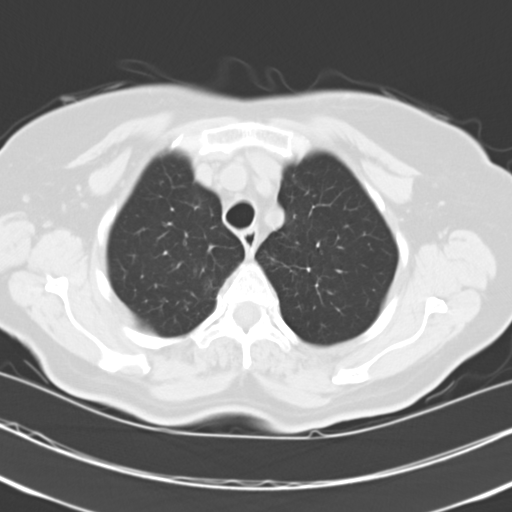
[im 52/61  lung]
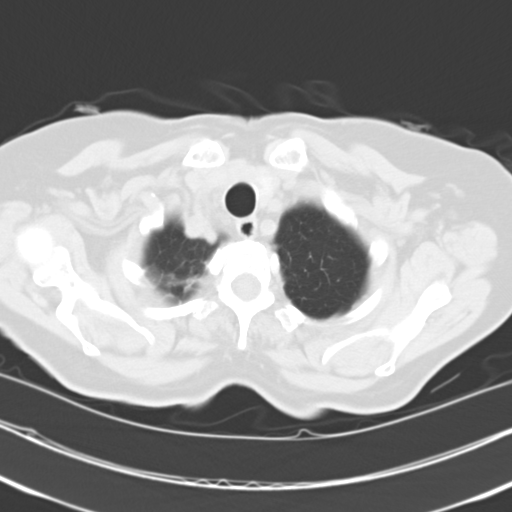
[im 56/61  lung]
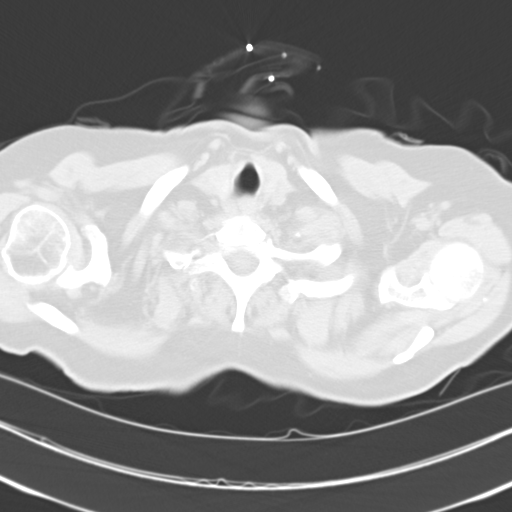

[Series 4: mpr coro 3mm · coronal · 0.59mm/px · 3 of 80 slices shown]
[im 16/80  lung]
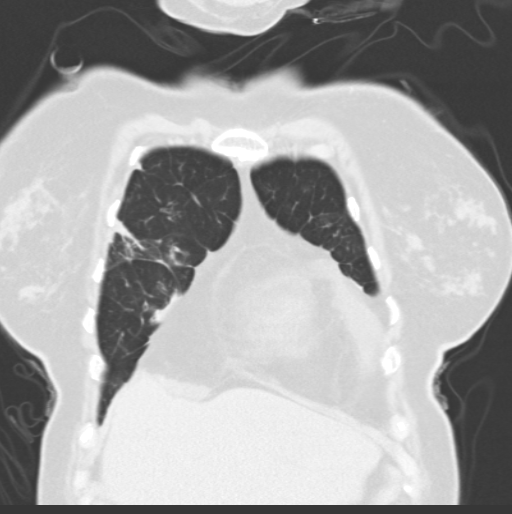
[im 32/80  lung]
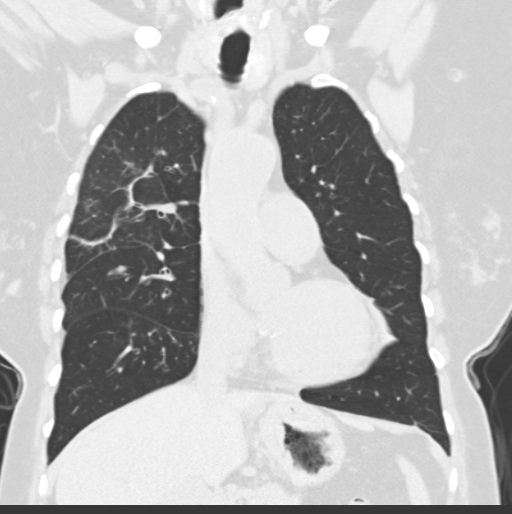
[im 48/80  lung]
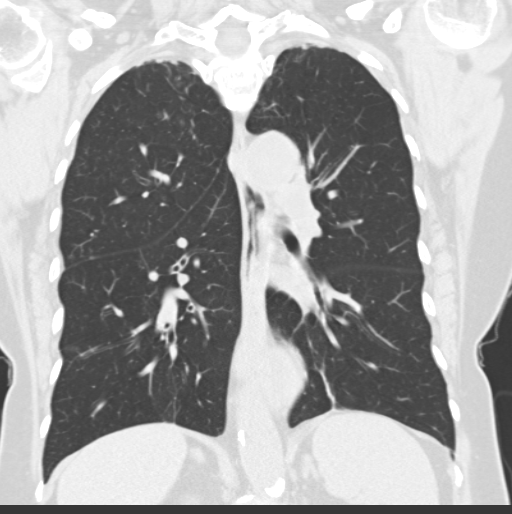

[15 of 36 positions shown; findings below may reference images not displayed]

FINDINGS: Within the peripheral right upper lobe, there is airspace
opacity and pleural thickening, as well as a small amount of
central bronchiectasis.  The margins are all convex which suggests
chronic infection, inflammation, or scarring rather than neoplasm.
The area is difficult to measure but is approximately 3.0 x 2.1 cm
on image number 26 which is the level of the carina.  The area is
seems slightly smaller than on the prior chest x-ray.  The
remainder of the right lung and the left lung are clear.

There is no axillary, hilar, mediastinal adenopathy.  A moderately
large hiatal hernia is noted., otherwise the visualized upper
abdomen is unremarkable.  There is a small pericardial effusion.
Degenerative changes are noted within the mid thoracic spine.
There are aortic atherosclerotic calcifications.
IMPRESSION: Airspace opacity and pleural thickening within the periphery of the
right upper lobe has an appearance most consistent with chronic
infection, inflammation, or scarring and appears slightly smaller
than on the most recent chest x-ray.  However, please continue to
follow up until clearing with either CT or chest x-ray.

## 2012-08-24 ENCOUNTER — Other Ambulatory Visit: Payer: Self-pay | Admitting: Obstetrics and Gynecology

## 2012-08-24 DIAGNOSIS — Z139 Encounter for screening, unspecified: Secondary | ICD-10-CM

## 2012-08-25 ENCOUNTER — Ambulatory Visit (HOSPITAL_COMMUNITY)
Admission: RE | Admit: 2012-08-25 | Discharge: 2012-08-25 | Disposition: A | Discharge status: Home / Self Care | Payer: Medicare Other | Source: Ambulatory Visit | Attending: Obstetrics and Gynecology | Admitting: Obstetrics and Gynecology

## 2012-08-25 DIAGNOSIS — Z139 Encounter for screening, unspecified: Secondary | ICD-10-CM

## 2012-08-25 DIAGNOSIS — Z1231 Encounter for screening mammogram for malignant neoplasm of breast: Secondary | ICD-10-CM | POA: Insufficient documentation

## 2012-10-28 ENCOUNTER — Encounter (HOSPITAL_COMMUNITY): Payer: Self-pay

## 2012-10-28 ENCOUNTER — Other Ambulatory Visit: Payer: Self-pay

## 2012-10-28 ENCOUNTER — Inpatient Hospital Stay (HOSPITAL_COMMUNITY)
Admission: EM | Admit: 2012-10-28 | Discharge: 2012-10-30 | DRG: 194 | Disposition: A | Discharge status: Home / Self Care | Payer: Medicare Other | Attending: Internal Medicine | Admitting: Internal Medicine

## 2012-10-28 ENCOUNTER — Emergency Department (HOSPITAL_COMMUNITY): Payer: Medicare Other

## 2012-10-28 DIAGNOSIS — E039 Hypothyroidism, unspecified: Secondary | ICD-10-CM | POA: Diagnosis present

## 2012-10-28 DIAGNOSIS — J13 Pneumonia due to Streptococcus pneumoniae: Principal | ICD-10-CM | POA: Diagnosis present

## 2012-10-28 DIAGNOSIS — R062 Wheezing: Secondary | ICD-10-CM

## 2012-10-28 DIAGNOSIS — E7800 Pure hypercholesterolemia, unspecified: Secondary | ICD-10-CM | POA: Diagnosis present

## 2012-10-28 DIAGNOSIS — N289 Disorder of kidney and ureter, unspecified: Secondary | ICD-10-CM

## 2012-10-28 DIAGNOSIS — E871 Hypo-osmolality and hyponatremia: Secondary | ICD-10-CM | POA: Diagnosis present

## 2012-10-28 DIAGNOSIS — Z79899 Other long term (current) drug therapy: Secondary | ICD-10-CM

## 2012-10-28 DIAGNOSIS — E860 Dehydration: Secondary | ICD-10-CM | POA: Diagnosis present

## 2012-10-28 DIAGNOSIS — I10 Essential (primary) hypertension: Secondary | ICD-10-CM | POA: Diagnosis present

## 2012-10-28 DIAGNOSIS — J441 Chronic obstructive pulmonary disease with (acute) exacerbation: Secondary | ICD-10-CM | POA: Diagnosis present

## 2012-10-28 DIAGNOSIS — J189 Pneumonia, unspecified organism: Secondary | ICD-10-CM

## 2012-10-28 DIAGNOSIS — J9801 Acute bronchospasm: Secondary | ICD-10-CM

## 2012-10-28 HISTORY — DX: Pure hypercholesterolemia, unspecified: E78.00

## 2012-10-28 HISTORY — DX: Chronic obstructive pulmonary disease, unspecified: J44.9

## 2012-10-28 HISTORY — DX: Disorder of thyroid, unspecified: E07.9

## 2012-10-28 LAB — CBC WITH DIFFERENTIAL/PLATELET
Basophils Relative: 0 % (ref 0–1)
Eosinophils Absolute: 0 10*3/uL (ref 0.0–0.7)
Eosinophils Relative: 0 % (ref 0–5)
HCT: 35.3 % — ABNORMAL LOW (ref 36.0–46.0)
Hemoglobin: 11.6 g/dL — ABNORMAL LOW (ref 12.0–15.0)
MCH: 27.5 pg (ref 26.0–34.0)
MCHC: 32.9 g/dL (ref 30.0–36.0)
Monocytes Absolute: 1 10*3/uL (ref 0.1–1.0)
Monocytes Relative: 5 % (ref 3–12)

## 2012-10-28 LAB — URINALYSIS, ROUTINE W REFLEX MICROSCOPIC
Bilirubin Urine: NEGATIVE
Ketones, ur: NEGATIVE mg/dL
Nitrite: NEGATIVE
Urobilinogen, UA: 0.2 mg/dL (ref 0.0–1.0)

## 2012-10-28 LAB — BASIC METABOLIC PANEL
BUN: 41 mg/dL — ABNORMAL HIGH (ref 6–23)
Creatinine, Ser: 1.26 mg/dL — ABNORMAL HIGH (ref 0.50–1.10)
GFR calc Af Amer: 46 mL/min — ABNORMAL LOW (ref >90)
GFR calc non Af Amer: 39 mL/min — ABNORMAL LOW (ref >90)

## 2012-10-28 MED ORDER — ONDANSETRON HCL 4 MG/2ML IJ SOLN: 4.0000 mg | Freq: Four times a day (QID) | INTRAMUSCULAR | Status: DC | PRN

## 2012-10-28 MED ORDER — HEPARIN SODIUM (PORCINE) 5000 UNIT/ML IJ SOLN
5000.0000 [IU] | Freq: Three times a day (TID) | INTRAMUSCULAR | Status: DC
  Administered 2012-10-29 – 2012-10-30 (×4): 5000 [IU] via SUBCUTANEOUS
  Filled 2012-10-28 (×4): qty 1

## 2012-10-28 MED ORDER — ACETAMINOPHEN 325 MG PO TABS: 650.0000 mg | ORAL_TABLET | Freq: Four times a day (QID) | ORAL | Status: DC | PRN

## 2012-10-28 MED ORDER — IPRATROPIUM BROMIDE 0.02 % IN SOLN
0.5000 mg | Freq: Once | RESPIRATORY_TRACT | Status: AC
  Administered 2012-10-28: 0.5 mg via RESPIRATORY_TRACT
  Filled 2012-10-28: qty 2.5

## 2012-10-28 MED ORDER — ACETAMINOPHEN 650 MG RE SUPP: 650.0000 mg | Freq: Four times a day (QID) | RECTAL | Status: DC | PRN

## 2012-10-28 MED ORDER — ALBUTEROL SULFATE (5 MG/ML) 0.5% IN NEBU: 2.5000 mg | INHALATION_SOLUTION | RESPIRATORY_TRACT | Status: DC | PRN

## 2012-10-28 MED ORDER — IPRATROPIUM BROMIDE 0.02 % IN SOLN
RESPIRATORY_TRACT | Status: AC
  Filled 2012-10-28: qty 2.5

## 2012-10-28 MED ORDER — ALBUTEROL SULFATE (5 MG/ML) 0.5% IN NEBU
2.5000 mg | INHALATION_SOLUTION | Freq: Four times a day (QID) | RESPIRATORY_TRACT | Status: DC
  Administered 2012-10-29: 2.5 mg via RESPIRATORY_TRACT
  Filled 2012-10-28: qty 0.5

## 2012-10-28 MED ORDER — PREDNISONE 50 MG PO TABS
60.0000 mg | ORAL_TABLET | Freq: Once | ORAL | Status: AC
  Administered 2012-10-28: 60 mg via ORAL
  Filled 2012-10-28: qty 1

## 2012-10-28 MED ORDER — LEVOTHYROXINE SODIUM 50 MCG PO TABS
50.0000 ug | ORAL_TABLET | Freq: Every day | ORAL | Status: DC
  Administered 2012-10-29 – 2012-10-30 (×2): 50 ug via ORAL
  Filled 2012-10-28 (×2): qty 1

## 2012-10-28 MED ORDER — ATORVASTATIN CALCIUM 40 MG PO TABS
40.0000 mg | ORAL_TABLET | Freq: Every day | ORAL | Status: DC
  Administered 2012-10-29: 40 mg via ORAL
  Filled 2012-10-28: qty 1

## 2012-10-28 MED ORDER — SODIUM CHLORIDE 0.9 % IV SOLN: INTRAVENOUS | Status: AC

## 2012-10-28 MED ORDER — ONDANSETRON HCL 4 MG PO TABS
4.0000 mg | ORAL_TABLET | Freq: Four times a day (QID) | ORAL | Status: DC | PRN
  Administered 2012-10-30: 4 mg via ORAL
  Filled 2012-10-28: qty 1

## 2012-10-28 MED ORDER — IPRATROPIUM BROMIDE 0.02 % IN SOLN
0.5000 mg | Freq: Once | RESPIRATORY_TRACT | Status: AC
  Administered 2012-10-28: 0.5 mg via RESPIRATORY_TRACT

## 2012-10-28 MED ORDER — LEVOFLOXACIN IN D5W 500 MG/100ML IV SOLN
500.0000 mg | Freq: Once | INTRAVENOUS | Status: AC
  Administered 2012-10-28: 500 mg via INTRAVENOUS
  Filled 2012-10-28: qty 100

## 2012-10-28 MED ORDER — ALBUTEROL SULFATE (5 MG/ML) 0.5% IN NEBU
5.0000 mg | INHALATION_SOLUTION | Freq: Once | RESPIRATORY_TRACT | Status: AC
  Administered 2012-10-28: 5 mg via RESPIRATORY_TRACT
  Filled 2012-10-28: qty 1

## 2012-10-28 MED ORDER — LEVOFLOXACIN IN D5W 750 MG/150ML IV SOLN: 750.0000 mg | INTRAVENOUS | Status: DC

## 2012-10-28 NOTE — H&P (Signed)
[facility] History and Physical  [name] WUJ:[id number] DOB: 1931/02/28 DOA: 10/28/2012   PCP: Dwana Melena, MD   Chief Complaint: Shortness of breath and cough since last week  HPI: [name] is a 79 y.o. female with a past medical history of hypertension, hypothyroidism, hypercholesterolemia, who was in her usual state of health till the last week when she started developing a cough and wheezing. The symptoms got worse, and she went to her primary care physician's office on Friday. She was given an injection of antibiotic, and was sent home on prednisone and cough syrup. However, was not given antibiotics to be taken at home. Her symptoms got worse. She was coughing up yellowish-green expectoration. Denies any blood in the sputum. Has been wheezing quite significantly. She did not feel any better with the prednisone. Her granddaughter is a Engineer, civil (consulting) at the cancer Center and she recommended that the patient be brought into the hospital for further evaluation. Patient admits to some indigestion, but denies any chest pain. Has had some difficulty breathing. Has had sick contacts in the form of great-grandchildren. Unsure about fever. Has received a flu shot this season. Denies any nausea, vomiting. Patient was accompanied by her daughter, who provided some of the history.  Home Medications: Prior to Admission medications   Medication Sig Start Date End Date Taking? Authorizing Provider  albuterol (PROVENTIL HFA;VENTOLIN HFA) 108 (90 BASE) MCG/ACT inhaler Inhale 2 puffs into the lungs every 6 (six) hours as needed. For shortness of breath   Yes Historical Provider, MD  amLODipine (NORVASC) 5 MG tablet Take 5 mg by mouth daily.   Yes Historical Provider, MD  cetirizine (ZYRTEC) 10 MG tablet Take 10 mg by mouth daily.   Yes Historical Provider, MD  fenofibrate micronized (LOFIBRA) 134 MG capsule Take 134 mg by mouth daily.   Yes Historical Provider, MD  HYDROcodone-homatropine  (HYDROMET) 5-1.5 MG/5ML syrup Take 5 mLs by mouth every 6 (six) hours as needed. For cough   Yes Historical Provider, MD  levothyroxine (SYNTHROID, LEVOTHROID) 50 MCG tablet Take 50 mcg by mouth every morning.   Yes Historical Provider, MD  montelukast (SINGULAIR) 10 MG tablet Take 10 mg by mouth daily.   Yes Historical Provider, MD  predniSONE (STERAPRED UNI-PAK) 10 MG tablet Take 10-60 mg by mouth daily. For 6 days. (6,5,4,3,2,1, STOP)   Yes Historical Provider, MD  rosuvastatin (CRESTOR) 20 MG tablet Take 20 mg by mouth every evening.   Yes Historical Provider, MD    Allergies: No Known Allergies  Past Medical History: Past Medical History  Diagnosis Date  . Hypertension   . COPD (chronic obstructive pulmonary disease)   . Hypercholesterolemia   . Thyroid disease     Past Surgical History  Procedure Date  . Dilation and curettage of uterus   . Abdominal hysterectomy   . Nephrostomy tube     Social History:  reports that she has never smoked. She does not have any smokeless tobacco history on file. She reports that she does not drink alcohol or use illicit drugs. 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  Living Situation: Lives in [place] with family Activity Level: Usually independent with daily activities.   Family History:  patient was unable to tell anything about her family history.  Review of Systems - History obtained from the patient General ROS: positive for  - fatigue Psychological ROS: negative Ophthalmic ROS: negative ENT ROS: negative Allergy and Immunology ROS: negative Hematological and  Lymphatic ROS: negative Endocrine ROS: negative Respiratory ROS: as in hpi Cardiovascular ROS: no chest pain or dyspnea on exertion Gastrointestinal ROS: no abdominal pain, change in bowel habits, or black or bloody stools Genito-Urinary ROS: no dysuria, trouble voiding, or hematuria Musculoskeletal ROS: negative Neurological ROS: no TIA or  stroke symptoms Dermatological ROS: negative  Physical Examination  Filed Vitals:   10/28/12 2057 10/28/12 2100 10/28/12 2200 10/28/12 2210  BP:    109/53  Pulse:  75 56 86  Temp:      TempSrc:      Resp:  18 30 22   Height:      Weight:      SpO2: 96% 97% 90% 91%    General appearance: alert, cooperative, appears stated age and no distress Head: Normocephalic, without obvious abnormality, atraumatic Eyes: conjunctivae/corneas clear. PERRL, EOM's intact.  Throat: lips, mucosa, and tongue normal; teeth and gums normal Neck: no adenopathy, no carotid bruit, no JVD, supple, symmetrical, trachea midline and thyroid not enlarged, symmetric, no tenderness/mass/nodules Back: symmetric, no curvature. ROM normal. No CVA tenderness. Resp: Rhonchi heard bilaterally, with crackles at the bases, right greater than left. Few end expiratory wheezing is also heard Cardio: regular rate and rhythm, S1, S2 normal, no murmur, click, rub or gallop GI: soft, non-tender; bowel sounds normal; no masses,  no organomegaly Extremities: extremities normal, atraumatic, no cyanosis or edema Pulses: 2+ and symmetric Skin: Skin color, texture, turgor normal. No rashes or lesions Lymph nodes: Cervical, supraclavicular, and axillary nodes normal. Neurologic: She is alert. Slightly distracted. No focal neurological deficits are present.  Laboratory Data: Results for orders placed during the hospital encounter of 10/28/12 (from the past 48 hour(s))  CBC WITH DIFFERENTIAL     Status: Abnormal   Collection Time   10/28/12  6:40 PM      Component Value Range Comment   WBC 19.1 (*) 4.0 - 10.5 K/uL    RBC 4.22  3.87 - 5.11 MIL/uL    Hemoglobin 11.6 (*) 12.0 - 15.0 g/dL    HCT 16.1 (*) 09.6 - 46.0 %    MCV 83.6  78.0 - 100.0 fL    MCH 27.5  26.0 - 34.0 pg    MCHC 32.9  30.0 - 36.0 g/dL    RDW 04.5  40.9 - 81.1 %    Platelets 264  150 - 400 K/uL    Neutrophils Relative 85 (*) 43 - 77 %    Neutro Abs 16.2 (*) 1.7  - 7.7 K/uL    Lymphocytes Relative 10 (*) 12 - 46 %    Lymphs Abs 1.9  0.7 - 4.0 K/uL    Monocytes Relative 5  3 - 12 %    Monocytes Absolute 1.0  0.1 - 1.0 K/uL    Eosinophils Relative 0  0 - 5 %    Eosinophils Absolute 0.0  0.0 - 0.7 K/uL    Basophils Relative 0  0 - 1 %    Basophils Absolute 0.0  0.0 - 0.1 K/uL   BASIC METABOLIC PANEL     Status: Abnormal   Collection Time   10/28/12  6:40 PM      Component Value Range Comment   Sodium 133 (*) 135 - 145 mEq/L    Potassium 4.0  3.5 - 5.1 mEq/L    Chloride 96  96 - 112 mEq/L    CO2 25  19 - 32 mEq/L    Glucose, Bld 147 (*) 70 - 99 mg/dL  BUN 41 (*) 6 - 23 mg/dL    Creatinine, Ser 1.61 (*) 0.50 - 1.10 mg/dL    Calcium 9.8  8.4 - 09.6 mg/dL    GFR calc non Af Amer 39 (*) >90 mL/min    GFR calc Af Amer 46 (*) >90 mL/min   URINALYSIS, ROUTINE W REFLEX MICROSCOPIC     Status: Normal   Collection Time   10/28/12  8:07 PM      Component Value Range Comment   Color, Urine YELLOW  YELLOW    APPearance CLEAR  CLEAR    Specific Gravity, Urine 1.010  1.005 - 1.030    pH 6.0  5.0 - 8.0    Glucose, UA NEGATIVE  NEGATIVE mg/dL    Hgb urine dipstick NEGATIVE  NEGATIVE    Bilirubin Urine NEGATIVE  NEGATIVE    Ketones, ur NEGATIVE  NEGATIVE mg/dL    Protein, ur NEGATIVE  NEGATIVE mg/dL    Urobilinogen, UA 0.2  0.0 - 1.0 mg/dL    Nitrite NEGATIVE  NEGATIVE    Leukocytes, UA NEGATIVE  NEGATIVE MICROSCOPIC NOT DONE ON URINES WITH NEGATIVE PROTEIN, BLOOD, LEUKOCYTES, NITRITE, OR GLUCOSE <1000 mg/dL.    Radiology Reports: Dg Chest 2 View  10/28/2012  *RADIOLOGY REPORT*  Clinical Data: Cough.  Green sputum.  CHEST - 2 VIEW  Comparison: 03/01/2011  Findings: Heart size is normal.  Mediastinal shadows are normal except for a hiatal hernia.  There is bronchopneumonia affecting the right lower lobe and probably the right middle lobe.  I think there is also some involvement also in the left lower lobe.  Upper lungs are clear.  No effusions.  No acute  bony finding.  IMPRESSION: Pneumonia in the lower lungs more pronounced on the right than the left.   Original Report Authenticated By: Paulina Fusi, M.D.     Electrocardiogram: Sinus rhythm at 76 beats per minute. Normal axis. There is right bundle branch block. No Q waves. No concerning ST or T-wave changes are noted. Changes are old compared to EKG from 2012.  Problem List  Principal Problem:  *CAP (community acquired pneumonia) Active Problems:  Wheezing  Hypothyroidism  HTN (hypertension), benign  Hypercholesteremia  Dehydration   Assessment: This is a 79 year old, Caucasian female, who presents with cough, wheezing for the last 10 days. She's found to have bilateral infiltrates. This is most likely community-acquired pneumonia. She had superimposed bronchitis, which was treated with steroids, as an outpatient. She also appears to be dehydrated with mild hyponatremia and mildly elevated BUN and creatinine.  Plan: #1 community-acquired pneumonia with wheezing: She'll be treated with Levaquin. Pharmacy will dose appropriately for renal function. Urine will be checked for strep and Legionella antigens. Oxygen will be provided as she was saturating about 90% with ambulation. We'll also give her nebulizer treatments for her wheezing. She has completed a course of prednisone. We will hold off on further steroids.  #2 dehydration with elevated BUN: She'll be given IV fluids. Renal function will be monitored closely.  #3 history of hypertension: Blood pressure is running soft. We'll hold her antihypertensive agent for now.  #4 history of hypercholesterolemia: Continue with Crestor or its equivalent.  DVT Prophylaxis: With heparin Code Status: Full code Family Communication: Discussed with patient and her daughter  Disposition Plan: Will likely return home when improved. Physical and occupational therapy will be involved.   Further management decisions will depend on results of further  testing and patient's response to treatment.  [facility]  [facility]  Pager [phone]  If 7PM-7AM, please contact night-coverage [email or web address] Password [facility]  10/28/2012, 10:38 PM

## 2012-10-28 NOTE — ED Provider Notes (Signed)
History     CSN: [id number]  Arrival date & time 10/28/12  1759   First MD Initiated Contact with Patient 10/28/12 1824      Chief Complaint  Patient presents with  . Shortness of Breath    (Consider location/radiation/quality/duration/timing/severity/associated sxs/prior treatment) HPI  Patient reports she has been having shortness of breath that started almost 2 weeks ago with cough productive of green or yellow sputum. She also has been wheezing. She states she has a central chest discomfort that goes into her right chest that's worse when she coughs. She denies fever, vomiting, diarrhea, sore throat, or rhinorrhea. She states she has an inhaler she uses at home with a spacer however it is not helping. She was seen by her primary care doctor about 5 days ago and was given a steroid taper pack, an antibiotic shot and cough syrup.she is not on oxygen at home.  Review of her records shows she did have pneumonia in June of 2012 however she was not admitted to the hospital but time.  PCP Dr Margo Aye  Past Medical History  Diagnosis Date  . Hypertension   . COPD (chronic obstructive pulmonary disease)   . Hypercholesterolemia   . Thyroid disease     Past Surgical History  Procedure Date  . Dilation and curettage of uterus   . Abdominal hysterectomy   . Nephrostomy tube     No family history on file.  History  Substance Use Topics  . Smoking status: Never Smoker   . Smokeless tobacco: Not on file  . Alcohol Use: No  + second hand smoke in the past Used to work in a [employer] at home Lives with GD  OB History    Grav Para Term Preterm Abortions TAB SAB Ect Mult Living                  Review of Systems  All other systems reviewed and are negative.    Allergies  Review of patient's allergies indicates no known allergies.  Home Medications   Current Outpatient Rx  Name  Route  Sig  Dispense  Refill  . ALBUTEROL SULFATE HFA 108 (90 BASE) MCG/ACT IN  AERS   Inhalation   Inhale 2 puffs into the lungs every 6 (six) hours as needed. For shortness of breath         . AMLODIPINE BESYLATE 5 MG PO TABS   Oral   Take 5 mg by mouth daily.         Marland Kitchen CETIRIZINE HCL 10 MG PO TABS   Oral   Take 10 mg by mouth daily.         . FENOFIBRATE MICRONIZED 134 MG PO CAPS   Oral   Take 134 mg by mouth daily.         Marland Kitchen HYDROCODONE-HOMATROPINE 5-1.5 MG/5ML PO SYRP   Oral   Take 5 mLs by mouth every 6 (six) hours as needed. For cough         . LEVOTHYROXINE SODIUM 50 MCG PO TABS   Oral   Take 50 mcg by mouth every morning.         Marland Kitchen MONTELUKAST SODIUM 10 MG PO TABS   Oral   Take 10 mg by mouth daily.         Marland Kitchen PREDNISONE (PAK) 10 MG PO TABS   Oral   Take 10-60 mg by mouth daily. For 6 days. (6,5,4,3,2,1, STOP)         .  ROSUVASTATIN CALCIUM 20 MG PO TABS   Oral   Take 20 mg by mouth every evening.           BP 121/57  Pulse 75  Temp 99 F (37.2 C) (Oral)  Resp 20  Ht 5\' 3"  (1.6 m)  Wt 165 lb (74.844 kg)  BMI 29.23 kg/m2  SpO2 96%  Vital signs normal low-grade temp   Physical Exam  Nursing note and vitals reviewed. Constitutional: She is oriented to person, place, and time. She appears well-developed and well-nourished.  Non-toxic appearance. She does not appear ill. No distress.  HENT:  Head: Normocephalic and atraumatic.  Right Ear: External ear normal.  Left Ear: External ear normal.  Nose: Nose normal. No mucosal edema or rhinorrhea.  Mouth/Throat: Oropharynx is clear and moist and mucous membranes are normal. No dental abscesses or uvula swelling.  Eyes: Conjunctivae normal and EOM are normal. Pupils are equal, round, and reactive to light.  Neck: Normal range of motion and full passive range of motion without pain. Neck supple.  Cardiovascular: Normal rate, regular rhythm and normal heart sounds.  Exam reveals no gallop and no friction rub.   No murmur heard. Pulmonary/Chest: Effort normal. No  respiratory distress. She has decreased breath sounds. She has wheezes. She has no rhonchi. She has no rales. She exhibits no tenderness and no crepitus.       Diffuse high pitched wheezing, frequent coughing, mild retractions  Abdominal: Soft. Normal appearance and bowel sounds are normal. She exhibits no distension. There is no tenderness. There is no rebound and no guarding.  Musculoskeletal: Normal range of motion. She exhibits no edema and no tenderness.       Moves all extremities well.   Neurological: She is alert and oriented to person, place, and time. She has normal strength. No cranial nerve deficit.  Skin: Skin is warm, dry and intact. No rash noted. No erythema. No pallor.  Psychiatric: She has a normal mood and affect. Her speech is normal and behavior is normal. Her mood appears not anxious.    ED Course  Procedures (including critical care time)   Medications  ipratropium (ATROVENT) 0.02 % nebulizer solution (   Canceled Entry 10/28/12 1912)  albuterol (PROVENTIL HFA;VENTOLIN HFA) 108 (90 BASE) MCG/ACT inhaler (not administered)  levothyroxine (SYNTHROID, LEVOTHROID) 50 MCG tablet (not administered)  fenofibrate micronized (LOFIBRA) 134 MG capsule (not administered)  montelukast (SINGULAIR) 10 MG tablet (not administered)  rosuvastatin (CRESTOR) 20 MG tablet (not administered)  cetirizine (ZYRTEC) 10 MG tablet (not administered)  amLODipine (NORVASC) 5 MG tablet (not administered)  HYDROcodone-homatropine (HYDROMET) 5-1.5 MG/5ML syrup (not administered)  predniSONE (STERAPRED UNI-PAK) 10 MG tablet (not administered)  albuterol (PROVENTIL) (5 MG/ML) 0.5% nebulizer solution 5 mg (5 mg Nebulization Given 10/28/12 1912)  predniSONE (DELTASONE) tablet 60 mg (60 mg Oral Given 10/28/12 1928)  ipratropium (ATROVENT) nebulizer solution 0.5 mg (0.5 mg Nebulization Given 10/28/12 1912)  albuterol (PROVENTIL) (5 MG/ML) 0.5% nebulizer solution 5 mg (5 mg Nebulization Given 10/28/12 2057)   ipratropium (ATROVENT) nebulizer solution 0.5 mg (0.5 mg Nebulization Given 10/28/12 2057)  levofloxacin (LEVAQUIN) IVPB 500 mg (500 mg Intravenous New Bag/Given 10/28/12 2044)   20:40 recheck after first nebulizer, now has low pitched rhonchi, still coughing and appears to be SOB, will repeat nebulizer. Have discussed she has pneumonia on CXR, antibiotics started.   21:20 recheck after second neb, has diffuse low pitched rhonchi, seems to be breathing easier, pulse ox 92% on 2 lpmNC. Will  check pulse ox while walking.   21:55 pulse ox 90% on RA while walking, still has some rhonchi  22:06 Dr Rito Ehrlich, hospitalist, admit to med-surg, team 2  Results for orders placed during the hospital encounter of 10/28/12  CBC WITH DIFFERENTIAL      Component Value Range   WBC 19.1 (*) 4.0 - 10.5 K/uL   RBC 4.22  3.87 - 5.11 MIL/uL   Hemoglobin 11.6 (*) 12.0 - 15.0 g/dL   HCT 16.1 (*) 09.6 - 04.5 %   MCV 83.6  78.0 - 100.0 fL   MCH 27.5  26.0 - 34.0 pg   MCHC 32.9  30.0 - 36.0 g/dL   RDW 40.9  81.1 - 91.4 %   Platelets 264  150 - 400 K/uL   Neutrophils Relative 85 (*) 43 - 77 %   Neutro Abs 16.2 (*) 1.7 - 7.7 K/uL   Lymphocytes Relative 10 (*) 12 - 46 %   Lymphs Abs 1.9  0.7 - 4.0 K/uL   Monocytes Relative 5  3 - 12 %   Monocytes Absolute 1.0  0.1 - 1.0 K/uL   Eosinophils Relative 0  0 - 5 %   Eosinophils Absolute 0.0  0.0 - 0.7 K/uL   Basophils Relative 0  0 - 1 %   Basophils Absolute 0.0  0.0 - 0.1 K/uL  BASIC METABOLIC PANEL      Component Value Range   Sodium 133 (*) 135 - 145 mEq/L   Potassium 4.0  3.5 - 5.1 mEq/L   Chloride 96  96 - 112 mEq/L   CO2 25  19 - 32 mEq/L   Glucose, Bld 147 (*) 70 - 99 mg/dL   BUN 41 (*) 6 - 23 mg/dL   Creatinine, Ser 7.82 (*) 0.50 - 1.10 mg/dL   Calcium 9.8  8.4 - 95.6 mg/dL   GFR calc non Af Amer 39 (*) >90 mL/min   GFR calc Af Amer 46 (*) >90 mL/min  URINALYSIS, ROUTINE W REFLEX MICROSCOPIC      Component Value Range   Color, Urine YELLOW  YELLOW    APPearance CLEAR  CLEAR   Specific Gravity, Urine 1.010  1.005 - 1.030   pH 6.0  5.0 - 8.0   Glucose, UA NEGATIVE  NEGATIVE mg/dL   Hgb urine dipstick NEGATIVE  NEGATIVE   Bilirubin Urine NEGATIVE  NEGATIVE   Ketones, ur NEGATIVE  NEGATIVE mg/dL   Protein, ur NEGATIVE  NEGATIVE mg/dL   Urobilinogen, UA 0.2  0.0 - 1.0 mg/dL   Nitrite NEGATIVE  NEGATIVE   Leukocytes, UA NEGATIVE  NEGATIVE   Laboratory interpretation all normal except renal insufficiency, leukocytosis   Dg Chest 2 View  10/28/2012  *RADIOLOGY REPORT*  Clinical Data: Cough.  Green sputum.  CHEST - 2 VIEW  Comparison: 03/01/2011  Findings: Heart size is normal.  Mediastinal shadows are normal except for a hiatal hernia.  There is bronchopneumonia affecting the right lower lobe and probably the right middle lobe.  I think there is also some involvement also in the left lower lobe.  Upper lungs are clear.  No effusions.  No acute bony finding.  IMPRESSION: Pneumonia in the lower lungs more pronounced on the right than the left.   Original Report Authenticated By: Paulina Fusi, M.D.     Date: 10/28/2012  Rate: 76  Rhythm: normal sinus rhythm  QRS Axis: normal  Intervals: normal  ST/T Wave abnormalities: normal  Conduction Disutrbances:IRBBB  Narrative Interpretation:  Old EKG Reviewed: unchanged from 02/27/2011   1. Community acquired pneumonia   2. Bronchospasm   3. Renal insufficiency     Plan admission  Devoria Albe, MD, FACEP   MDM          Ward Givens, MD 10/28/12 2212

## 2012-10-28 NOTE — ED Notes (Signed)
Pt's O2 sats between 90-91% on ambulation.

## 2012-10-28 NOTE — ED Notes (Signed)
Pt saw Dr. Margo Aye last week and was diagnosed with bronchitis, had steroid taper, antibiotic shot, and hydromet cough syrup.  Pt no better.  Reports has productive cough with green sputum.  Denies fever.   Says feels sore from coughing so much.

## 2012-10-28 NOTE — ED Notes (Signed)
Family at bedside. 

## 2012-10-29 DIAGNOSIS — J9801 Acute bronchospasm: Secondary | ICD-10-CM

## 2012-10-29 LAB — COMPREHENSIVE METABOLIC PANEL WITH GFR
ALT: 21 U/L (ref 0–35)
AST: 13 U/L (ref 0–37)
Albumin: 3 g/dL — ABNORMAL LOW (ref 3.5–5.2)
Alkaline Phosphatase: 58 U/L (ref 39–117)
BUN: 44 mg/dL — ABNORMAL HIGH (ref 6–23)
CO2: 22 meq/L (ref 19–32)
Calcium: 9.3 mg/dL (ref 8.4–10.5)
Chloride: 104 meq/L (ref 96–112)
Creatinine, Ser: 1.15 mg/dL — ABNORMAL HIGH (ref 0.50–1.10)
GFR calc Af Amer: 51 mL/min — ABNORMAL LOW
GFR calc non Af Amer: 44 mL/min — ABNORMAL LOW
Glucose, Bld: 183 mg/dL — ABNORMAL HIGH (ref 70–99)
Potassium: 3.7 meq/L (ref 3.5–5.1)
Sodium: 138 meq/L (ref 135–145)
Total Bilirubin: 0.4 mg/dL (ref 0.3–1.2)
Total Protein: 7.1 g/dL (ref 6.0–8.3)

## 2012-10-29 LAB — CBC
HCT: 33.4 % — ABNORMAL LOW (ref 36.0–46.0)
Platelets: 218 10*3/uL (ref 150–400)
RDW: 14.5 % (ref 11.5–15.5)
WBC: 14.8 10*3/uL — ABNORMAL HIGH (ref 4.0–10.5)

## 2012-10-29 LAB — HIV ANTIBODY (ROUTINE TESTING W REFLEX): HIV: NONREACTIVE

## 2012-10-29 LAB — INFLUENZA PANEL BY PCR (TYPE A & B)
H1N1 flu by pcr: NOT DETECTED
Influenza B By PCR: NEGATIVE

## 2012-10-29 LAB — STREP PNEUMONIAE URINARY ANTIGEN: Strep Pneumo Urinary Antigen: POSITIVE — AB

## 2012-10-29 MED ORDER — ALBUTEROL SULFATE (5 MG/ML) 0.5% IN NEBU
2.5000 mg | INHALATION_SOLUTION | RESPIRATORY_TRACT | Status: DC
  Administered 2012-10-29 – 2012-10-30 (×6): 2.5 mg via RESPIRATORY_TRACT
  Filled 2012-10-29 (×7): qty 0.5

## 2012-10-29 MED ORDER — LEVOFLOXACIN IN D5W 750 MG/150ML IV SOLN
750.0000 mg | INTRAVENOUS | Status: DC
  Administered 2012-10-29: 750 mg via INTRAVENOUS
  Filled 2012-10-29: qty 150

## 2012-10-29 MED ORDER — PREDNISONE 20 MG PO TABS
40.0000 mg | ORAL_TABLET | Freq: Every day | ORAL | Status: DC
  Administered 2012-10-29 – 2012-10-30 (×2): 40 mg via ORAL
  Filled 2012-10-29 (×2): qty 2

## 2012-10-29 MED ORDER — SODIUM CHLORIDE 0.9 % IV BOLUS (SEPSIS)
500.0000 mL | Freq: Once | INTRAVENOUS | Status: AC
  Administered 2012-10-29: 500 mL via INTRAVENOUS

## 2012-10-29 NOTE — Progress Notes (Signed)
ANTIBIOTIC CONSULT NOTE - INITIAL  Pharmacy Consult for Levaquin Indication: rule out pneumonia  No Known Allergies  Patient Measurements: Height: 5\' 3"  (160 cm) Weight: 162 lb 7.7 oz (73.7 kg) IBW/kg (Calculated) : 52.4   Vital Signs: Temp: 97.3 F (36.3 C) (01/30 0403) Temp src: Axillary (01/30 0403) BP: 97/64 mmHg (01/30 0403) Pulse Rate: 64  (01/30 0403) Intake/Output from previous day: 01/29 0701 - 01/30 0700 In: 120 [P.O.:120] Out: 200 [Urine:200] Intake/Output from this shift:    Labs:  Basename 10/29/12 0449 10/28/12 1840  WBC 14.8* 19.1*  HGB 11.0* 11.6*  PLT 218 264  LABCREA -- --  CREATININE 1.15* 1.26*   Estimated Creatinine Clearance: 38.1 ml/min (by C-G formula based on Cr of 1.15). No results found for this basename: VANCOTROUGH:2,VANCOPEAK:2,VANCORANDOM:2,GENTTROUGH:2,GENTPEAK:2,GENTRANDOM:2,TOBRATROUGH:2,TOBRAPEAK:2,TOBRARND:2,AMIKACINPEAK:2,AMIKACINTROU:2,AMIKACIN:2, in the last 72 hours   Microbiology: No results found for this or any previous visit (from the past 720 hour(s)).  Medical History: Past Medical History  Diagnosis Date  . Hypertension   . COPD (chronic obstructive pulmonary disease)   . Hypercholesterolemia   . Thyroid disease     Medications:  Scheduled:    . albuterol  2.5 mg Nebulization Q4H WA  . [COMPLETED] albuterol  5 mg Nebulization Once  . [COMPLETED] albuterol  5 mg Nebulization Once  . atorvastatin  40 mg Oral q1800  . heparin  5,000 Units Subcutaneous Q8H  . [EXPIRED] ipratropium      . [COMPLETED] ipratropium  0.5 mg Nebulization Once  . [COMPLETED] ipratropium  0.5 mg Nebulization Once  . [COMPLETED] levofloxacin (LEVAQUIN) IV  500 mg Intravenous Once  . levofloxacin (LEVAQUIN) IV  750 mg Intravenous Q48H  . levothyroxine  50 mcg Oral QAC breakfast  . [COMPLETED] predniSONE  60 mg Oral Once  . [DISCONTINUED] albuterol  2.5 mg Nebulization Q6H  . [DISCONTINUED] levofloxacin (LEVAQUIN) IV  750 mg Intravenous  Q24H   Assessment: 79 yo F with PNA started on Levaquin in ED.  Renal insufficiency noted. She is afebrile and leukocytosis is improving.    Goal of Therapy:  Eradicate infection.  Plan:  1) Levaquin 750mg  IV q48h 2) Monitor renal function and cx data   Elson Clan 10/29/2012,7:39 AM

## 2012-10-29 NOTE — Care Management Note (Unsigned)
    Page 1 of 1   10/29/2012     11:21:55 AM   CARE MANAGEMENT NOTE 10/29/2012  Patient:  [name], [name]   Account Number:  [id number]  Date Initiated:  10/29/2012  Documentation initiated by:  Rosemary Holms  Subjective/Objective Assessment:   Pt admitted for Bilateral PNA/COPD. Lives with her granddaughter and states she  was independent prior to getting sick.     Action/Plan:   plans to DC back with grandaughter Hailey, who works in the cancer ctr at [employer]   Anticipated DC Date:  10/31/2012   Anticipated DC Plan:  HOME/SELF CARE      DC Planning Services  CM consult      Choice offered to / List presented to:             Status of service:  In process, will continue to follow Medicare Important Message given?   (If response is "NO", the following Medicare IM given date fields will be blank) Date Medicare IM given:   Date Additional Medicare IM given:    Discharge Disposition:    Per UR Regulation:    If discussed at Long Length of Stay Meetings, dates discussed:    Comments:  10/29/12 Rosemary Holms RN BSN CM

## 2012-10-29 NOTE — Progress Notes (Signed)
UR Chart Review Completed  

## 2012-10-29 NOTE — Clinical Documentation Improvement (Signed)
RENAL FAILURE DOCUMENTATION CLARIFICATION QUERY  THIS DOCUMENT IS NOT A PERMANENT PART OF THE MEDICAL RECORD  TO RESPOND TO THE THIS QUERY, FOLLOW THE INSTRUCTIONS BELOW:  1. If needed, update documentation for the patient's encounter via the notes activity.  2. Access this query again and click edit on the In [employer].  3. After updating, or not, click F2 to complete all highlighted (required) fields concerning your review. Select "additional documentation in the medical record" OR "no additional documentation provided".  4. Click Sign note button.  5. The deficiency will fall out of your In Basket *Please let us know if you are not able to complete this workflow by phone or e-mail (listed below).  Please update your documentation within the medical record to reflect your response to this query.                                                                                    10/29/12  Dear Dr. Karilyn Cota  In a better effort to capture your patient's severity of illness, reflect appropriate length of stay and utilization of resources, a review of the patient medical record has revealed the following indicators.    Based on your clinical judgment, please clarify and document in a progress note and/or discharge summary the clinical condition associated with the following supporting information:  In responding to this query please exercise your independent judgment.  The fact that a query is asked, does not imply that any particular answer is desired or expected.  Please clarify renal status  Possible Clinical Conditions?  Acute Renal Failure Acute Kidney Injury Acute Tubular Necrosis Acute on Chronic Renal Failure Chronic Renal Failure Other Condition_______________ Cannot Clinically Determine     Clinical Information:  Risk Factors: Renal insufficiency noted on history & physical  Dehydration Advanced age  Diagnostics: -BUN/CR/GFR: 02/27/11 = 16/0.65/>60 10/28/12 =  41/1.26/39 10/29/12 = 44/1.15/44  Treatments: "Renal function will be monitored closely" IV NS@100ml /h Monitoring I                   You may use possible, probable, or suspect with inpatient documentation. possible, probable, suspected diagnoses MUST be documented at the time of discharge  Reviewed: No response Thank You,  Harless Litten RN, MSN Clinical Documentation Specialist: Office# [phone] [facility] Health Information Management [place]

## 2012-10-29 NOTE — Progress Notes (Signed)
     Subjective: This lady was admitted yesterday with a two-week history of productive cough and a three-day history of dyspnea. She has not had subjective fevers. She did taken influenza vaccination. She was found to have bilateral lower lobe pneumonia, more on the right than the left.           Physical Exam: Blood pressure 97/64, pulse 64, temperature 97.3 F (36.3 C), temperature source Axillary, resp. rate 16, height 5\' 3"  (1.6 m), weight 73.7 kg (162 lb 7.7 oz), SpO2 95.00%. Surprisingly, she looks systemically well. She is not toxic or septic. She does not appear to have increased work of breathing. She does have widespread rhonchi/wheezing. Chest crackles in the right mid and lower zones and also in the left lower zone. There is no bronchial breathing. There is no peripheral central cyanosis. She is alert and orientated.        Basic Metabolic Panel:  Basename 10/29/12 0449 10/28/12 1840  NA 138 133*  K 3.7 4.0  CL 104 96  CO2 22 25  GLUCOSE 183* 147*  BUN 44* 41*  CREATININE 1.15* 1.26*  CALCIUM 9.3 9.8  MG -- --  PHOS -- --   Liver Function Tests:  [facility] 10/29/12 0449  AST 13  ALT 21  ALKPHOS 58  BILITOT 0.4  PROT 7.1  ALBUMIN 3.0*     CBC:  Basename 10/29/12 0449 10/28/12 1840  WBC 14.8* 19.1*  NEUTROABS -- 16.2*  HGB 11.0* 11.6*  HCT 33.4* 35.3*  MCV 83.1 83.6  PLT 218 264    Dg Chest 2 View  10/28/2012  *RADIOLOGY REPORT*  Clinical Data: Cough.  Green sputum.  CHEST - 2 VIEW  Comparison: 03/01/2011  Findings: Heart size is normal.  Mediastinal shadows are normal except for a hiatal hernia.  There is bronchopneumonia affecting the right lower lobe and probably the right middle lobe.  I think there is also some involvement also in the left lower lobe.  Upper lungs are clear.  No effusions.  No acute bony finding.  IMPRESSION: Pneumonia in the lower lungs more pronounced on the right than the left.   Original Report Authenticated By: Paulina Fusi, M.D.       Medications: I have reviewed the patient's current medications.  Impression: 1. Bilateral community-acquired pneumonia, right greater than left. 2. Underlying bronchitis/COPD exacerbation 3. Hypertension, currently hypotensive, although not clinically in shock. 4. Dehydration, improving with IV fluids.     Plan: 1. Normal saline bolus to improve blood pressure. 2. Start oral prednisone 40 mg daily. 3. Continue with intravenous Levaquin. 4. Check influenza by PCR. 5. Disposition-home when medically stable, I think she will require 2-3 days.     LOS: 1 day   Wilson Singer Pager [phone]  10/29/2012, 8:05 AM

## 2012-10-30 DIAGNOSIS — E039 Hypothyroidism, unspecified: Secondary | ICD-10-CM

## 2012-10-30 DIAGNOSIS — J13 Pneumonia due to Streptococcus pneumoniae: Principal | ICD-10-CM

## 2012-10-30 LAB — BASIC METABOLIC PANEL
GFR calc Af Amer: 36 mL/min — ABNORMAL LOW (ref >90)
GFR calc non Af Amer: 31 mL/min — ABNORMAL LOW (ref >90)
Potassium: 3.4 mEq/L — ABNORMAL LOW (ref 3.5–5.1)
Sodium: 138 mEq/L (ref 135–145)

## 2012-10-30 LAB — CBC
Hemoglobin: 10.6 g/dL — ABNORMAL LOW (ref 12.0–15.0)
MCHC: 32.5 g/dL (ref 30.0–36.0)
RDW: 14.7 % (ref 11.5–15.5)

## 2012-10-30 LAB — LEGIONELLA ANTIGEN, URINE: Legionella Antigen, Urine: NEGATIVE

## 2012-10-30 MED ORDER — ALBUTEROL SULFATE HFA 108 (90 BASE) MCG/ACT IN AERS: 2.0000 | INHALATION_SPRAY | RESPIRATORY_TRACT | Status: DC | PRN

## 2012-10-30 MED ORDER — LORATADINE 10 MG PO TABS
10.0000 mg | ORAL_TABLET | Freq: Every day | ORAL | Status: DC
  Administered 2012-10-30: 10 mg via ORAL
  Filled 2012-10-30: qty 1

## 2012-10-30 MED ORDER — CEFUROXIME AXETIL 250 MG PO TABS
500.0000 mg | ORAL_TABLET | Freq: Two times a day (BID) | ORAL | Status: DC
Start: 2012-10-30 — End: 2012-10-30
  Administered 2012-10-30: 500 mg via ORAL
  Filled 2012-10-30: qty 2

## 2012-10-30 MED ORDER — NYSTATIN 100000 UNIT/ML MT SUSP
5.0000 mL | Freq: Four times a day (QID) | OROMUCOSAL | Status: DC
  Administered 2012-10-30 (×2): 500000 [IU] via ORAL
  Filled 2012-10-30 (×2): qty 5

## 2012-10-30 MED ORDER — FENOFIBRATE 160 MG PO TABS
160.0000 mg | ORAL_TABLET | Freq: Every day | ORAL | Status: DC
  Administered 2012-10-30: 160 mg via ORAL
  Filled 2012-10-30 (×4): qty 1

## 2012-10-30 MED ORDER — MONTELUKAST SODIUM 10 MG PO TABS
10.0000 mg | ORAL_TABLET | Freq: Every day | ORAL | Status: DC
  Administered 2012-10-30: 10 mg via ORAL
  Filled 2012-10-30: qty 1

## 2012-10-30 MED ORDER — ALBUTEROL SULFATE (5 MG/ML) 0.5% IN NEBU
2.5000 mg | INHALATION_SOLUTION | Freq: Four times a day (QID) | RESPIRATORY_TRACT | Status: DC
  Administered 2012-10-30: 2.5 mg via RESPIRATORY_TRACT
  Filled 2012-10-30: qty 0.5

## 2012-10-30 MED ORDER — CEFUROXIME AXETIL 500 MG PO TABS: 500.0000 mg | ORAL_TABLET | Freq: Two times a day (BID) | ORAL | Status: DC

## 2012-10-30 MED ORDER — POTASSIUM CHLORIDE CRYS ER 20 MEQ PO TBCR
20.0000 meq | EXTENDED_RELEASE_TABLET | Freq: Once | ORAL | Status: AC
  Administered 2012-10-30: 20 meq via ORAL
  Filled 2012-10-30: qty 1

## 2012-10-30 MED ORDER — NYSTATIN 100000 UNIT/ML MT SUSP: 5.0000 mL | Freq: Four times a day (QID) | OROMUCOSAL | Status: DC

## 2012-10-30 MED ORDER — ACETAMINOPHEN 325 MG PO TABS: 650.0000 mg | ORAL_TABLET | Freq: Four times a day (QID) | ORAL | Status: DC | PRN

## 2012-10-30 NOTE — Discharge Summary (Signed)
Physician Discharge Summary  Patient ID: ANHELICA FOWERS MRN: [id number] DOB/AGE: [birth year] 79 y.o.  Admit date: 10/28/2012 Discharge date: 10/30/2012  Discharge Diagnoses:  Principal Problem:  *Pneumococcal pneumonia Active Problems:  Wheezing  Hypothyroidism  HTN (hypertension), benign  Hypercholesteremia  Dehydration     Medication List     As of 10/30/2012 11:27 AM    TAKE these medications         acetaminophen 325 MG tablet   Commonly known as: TYLENOL   Take 2 tablets (650 mg total) by mouth every 6 (six) hours as needed (or Fever >/= 101).      albuterol 108 (90 BASE) MCG/ACT inhaler   Commonly known as: PROVENTIL HFA;VENTOLIN HFA   Inhale 2 puffs into the lungs every 4 (four) hours as needed for wheezing. For shortness of breath      amLODipine 5 MG tablet   Commonly known as: NORVASC   Take 5 mg by mouth daily.      cefUROXime 500 MG tablet   Commonly known as: CEFTIN   Take 1 tablet (500 mg total) by mouth 2 (two) times daily with a meal.      cetirizine 10 MG tablet   Commonly known as: ZYRTEC   Take 10 mg by mouth daily.      fenofibrate micronized 134 MG capsule   Commonly known as: LOFIBRA   Take 134 mg by mouth daily.      HYDROMET 5-1.5 MG/5ML syrup   Generic drug: HYDROcodone-homatropine   Take 5 mLs by mouth every 6 (six) hours as needed. For cough      levothyroxine 50 MCG tablet   Commonly known as: SYNTHROID, LEVOTHROID   Take 50 mcg by mouth every morning.      montelukast 10 MG tablet   Commonly known as: SINGULAIR   Take 10 mg by mouth daily.      nystatin 100000 UNIT/ML suspension   Commonly known as: MYCOSTATIN   Take 5 mLs (500,000 Units total) by mouth 4 (four) times daily. For thrush/mouth pain      predniSONE 10 MG tablet   Commonly known as: STERAPRED UNI-PAK   Take 10-60 mg by mouth daily. For 6 days. (6,5,4,3,2,1, STOP)      rosuvastatin 20 MG tablet   Commonly known as: CRESTOR   Take 20 mg by mouth every  evening.            Discharge Orders    Future Orders Please Complete By Expires   Diet - low sodium heart healthy      Increase activity slowly         Follow-up Information    Follow up with [facility], MD. In 3 weeks.   Contact information[place] [place] [id number]          Disposition: 01-Home or Self Care  Discharged Condition: stable  Consults:  none  Labs:   Results for orders placed during the hospital encounter of 10/28/12 (from the past 48 hour(s))  CBC WITH DIFFERENTIAL     Status: Abnormal   Collection Time   10/28/12  6:40 PM      Component Value Range Comment   WBC 19.1 (*) 4.0 - 10.5 K/uL    RBC 4.22  3.87 - 5.11 MIL/uL    Hemoglobin 11.6 (*) 12.0 - 15.0 g/dL    HCT 78.4 (*) 69.6 - 46.0 %    MCV 83.6  78.0 - 100.0 fL    MCH 27.5  26.0 - 34.0 pg    MCHC 32.9  30.0 - 36.0 g/dL    RDW 16.1  09.6 - 04.5 %    Platelets 264  150 - 400 K/uL    Neutrophils Relative 85 (*) 43 - 77 %    Neutro Abs 16.2 (*) 1.7 - 7.7 K/uL    Lymphocytes Relative 10 (*) 12 - 46 %    Lymphs Abs 1.9  0.7 - 4.0 K/uL    Monocytes Relative 5  3 - 12 %    Monocytes Absolute 1.0  0.1 - 1.0 K/uL    Eosinophils Relative 0  0 - 5 %    Eosinophils Absolute 0.0  0.0 - 0.7 K/uL    Basophils Relative 0  0 - 1 %    Basophils Absolute 0.0  0.0 - 0.1 K/uL   BASIC METABOLIC PANEL     Status: Abnormal   Collection Time   10/28/12  6:40 PM      Component Value Range Comment   Sodium 133 (*) 135 - 145 mEq/L    Potassium 4.0  3.5 - 5.1 mEq/L    Chloride 96  96 - 112 mEq/L    CO2 25  19 - 32 mEq/L    Glucose, Bld 147 (*) 70 - 99 mg/dL    BUN 41 (*) 6 - 23 mg/dL    Creatinine, Ser 4.09 (*) 0.50 - 1.10 mg/dL    Calcium 9.8  8.4 - 81.1 mg/dL    GFR calc non Af Amer 39 (*) >90 mL/min    GFR calc Af Amer 46 (*) >90 mL/min   URINALYSIS, ROUTINE W REFLEX MICROSCOPIC     Status: Normal   Collection Time   10/28/12  8:07 PM      Component Value Range Comment   Color, Urine YELLOW  YELLOW     APPearance CLEAR  CLEAR    Specific Gravity, Urine 1.010  1.005 - 1.030    pH 6.0  5.0 - 8.0    Glucose, UA NEGATIVE  NEGATIVE mg/dL    Hgb urine dipstick NEGATIVE  NEGATIVE    Bilirubin Urine NEGATIVE  NEGATIVE    Ketones, ur NEGATIVE  NEGATIVE mg/dL    Protein, ur NEGATIVE  NEGATIVE mg/dL    Urobilinogen, UA 0.2  0.0 - 1.0 mg/dL    Nitrite NEGATIVE  NEGATIVE    Leukocytes, UA NEGATIVE  NEGATIVE MICROSCOPIC NOT DONE ON URINES WITH NEGATIVE PROTEIN, BLOOD, LEUKOCYTES, NITRITE, OR GLUCOSE <1000 mg/dL.  HIV ANTIBODY (ROUTINE TESTING)     Status: Normal   Collection Time   10/28/12 10:49 PM      Component Value Range Comment   HIV NON REACTIVE  NON REACTIVE   STREP PNEUMONIAE URINARY ANTIGEN     Status: Abnormal   Collection Time   10/29/12  1:59 AM      Component Value Range Comment   Strep Pneumo Urinary Antigen POSITIVE (*) NEGATIVE   COMPREHENSIVE METABOLIC PANEL     Status: Abnormal   Collection Time   10/29/12  4:49 AM      Component Value Range Comment   Sodium 138  135 - 145 mEq/L    Potassium 3.7  3.5 - 5.1 mEq/L    Chloride 104  96 - 112 mEq/L DELTA CHECK NOTED   CO2 22  19 - 32 mEq/L    Glucose, Bld 183 (*) 70 - 99 mg/dL    BUN 44 (*) 6 - 23 mg/dL  Creatinine, Ser 1.15 (*) 0.50 - 1.10 mg/dL    Calcium 9.3  8.4 - 16.1 mg/dL    Total Protein 7.1  6.0 - 8.3 g/dL    Albumin 3.0 (*) 3.5 - 5.2 g/dL    AST 13  0 - 37 U/L    ALT 21  0 - 35 U/L    Alkaline Phosphatase 58  39 - 117 U/L    Total Bilirubin 0.4  0.3 - 1.2 mg/dL    GFR calc non Af Amer 44 (*) >90 mL/min    GFR calc Af Amer 51 (*) >90 mL/min   CBC     Status: Abnormal   Collection Time   10/29/12  4:49 AM      Component Value Range Comment   WBC 14.8 (*) 4.0 - 10.5 K/uL    RBC 4.02  3.87 - 5.11 MIL/uL    Hemoglobin 11.0 (*) 12.0 - 15.0 g/dL    HCT 09.6 (*) 04.5 - 46.0 %    MCV 83.1  78.0 - 100.0 fL    MCH 27.4  26.0 - 34.0 pg    MCHC 32.9  30.0 - 36.0 g/dL    RDW 40.9  81.1 - 91.4 %    Platelets 218  150 -  400 K/uL   INFLUENZA PANEL BY PCR     Status: Normal   Collection Time   10/29/12  9:27 AM      Component Value Range Comment   Influenza A By PCR NEGATIVE  NEGATIVE    Influenza B By PCR NEGATIVE  NEGATIVE    H1N1 flu by pcr NOT DETECTED  NOT DETECTED   CBC     Status: Abnormal   Collection Time   10/30/12  4:15 AM      Component Value Range Comment   WBC 15.6 (*) 4.0 - 10.5 K/uL    RBC 3.90  3.87 - 5.11 MIL/uL    Hemoglobin 10.6 (*) 12.0 - 15.0 g/dL    HCT 78.2 (*) 95.6 - 46.0 %    MCV 83.6  78.0 - 100.0 fL    MCH 27.2  26.0 - 34.0 pg    MCHC 32.5  30.0 - 36.0 g/dL    RDW 21.3  08.6 - 57.8 %    Platelets 296  150 - 400 K/uL DELTA CHECK NOTED  BASIC METABOLIC PANEL     Status: Abnormal   Collection Time   10/30/12  4:15 AM      Component Value Range Comment   Sodium 138  135 - 145 mEq/L    Potassium 3.4 (*) 3.5 - 5.1 mEq/L    Chloride 102  96 - 112 mEq/L    CO2 22  19 - 32 mEq/L    Glucose, Bld 136 (*) 70 - 99 mg/dL    BUN 44 (*) 6 - 23 mg/dL    Creatinine, Ser 4.69 (*) 0.50 - 1.10 mg/dL    Calcium 9.6  8.4 - 62.9 mg/dL    GFR calc non Af Amer 31 (*) >90 mL/min    GFR calc Af Amer 36 (*) >90 mL/min     Diagnostics:  Dg Chest 2 View  10/28/2012  *RADIOLOGY REPORT*  Clinical Data: Cough.  Green sputum.  CHEST - 2 VIEW  Comparison: 03/01/2011  Findings: Heart size is normal.  Mediastinal shadows are normal except for a hiatal hernia.  There is bronchopneumonia affecting the right lower lobe and probably the right middle lobe.  I  think there is also some involvement also in the left lower lobe.  Upper lungs are clear.  No effusions.  No acute bony finding.  IMPRESSION: Pneumonia in the lower lungs more pronounced on the right than the left.   Original Report Authenticated By: Paulina Fusi, M.D.     Full Code   Hospital Course: See H&P for complete admission details. The patient is a 79 year old white female who presented with shortness of breath cough wheezing for about a week.  She had been seen by her primary care provider. She was given cough medication and steroids. She had sick contacts in the family. In the emergency room, she had normal vital signs. She was in no distress. She had rhonchi and wheezes and rales at the bases. White blood cell count was high, but patient had been on prednisone prior to admission. Chest x-ray showed bibasilar pneumonia. She was admitted, started on levofloxacin, steroids, bronchodilators, oxygen. Her symptoms improved. By the time of discharge she was able to ambulate. She had no hypoxia at discharge. She did have a few episodes of hypoxia the day prior to discharge, but has ambulated with saturations remaining in the mid 90s. Urine strep pneumonia is positive. She will be discharged on 7 more days of antibiotic, Ceftin to cover the pneumococcal pneumonia. She also was dehydrated on admission and was given IV fluids. She is improved and may discharge back home. Recommend followup chest x-ray in 4-6 weeks. total time on the day of discharge greater than 30 minutes.  Discharge Exam:  Blood pressure 126/77, pulse 97, temperature 97.6 F (36.4 C), temperature source Oral, resp. rate 18, height 5\' 3"  (1.6 m), weight 73.7 kg (162 lb 7.7 oz), SpO2 92.00%.  General: Comfortable. Breathing nonlabored HEENT: Mouth and tongue be febrile. No white plaques Lungs: Mild rhonchi and wheezes. Good air movement. No rales. Extremities no clubbing cyanosis or edema Cardiovascular regular rate rhythm without murmurs gallops rubs  Signed: Kyan Yurkovich L 10/30/2012, 11:27 AM

## 2012-10-30 NOTE — Progress Notes (Signed)
AVS reviewed with pt and pt's granddaughter (caretaker).  Verbalized understanding of instructions.  Prescriptions provided to pt's granddaughter,Hailey.  Pt's IV removed.  Site WNL.  Pt transported by NT via w/c to main entrance for d/c.  Pt stable at time of d/c.

## 2012-12-21 ENCOUNTER — Ambulatory Visit (HOSPITAL_COMMUNITY)
Admission: RE | Admit: 2012-12-21 | Discharge: 2012-12-21 | Disposition: A | Discharge status: Home / Self Care | Payer: Medicare Other | Source: Ambulatory Visit | Attending: Internal Medicine | Admitting: Internal Medicine

## 2012-12-21 ENCOUNTER — Other Ambulatory Visit (HOSPITAL_COMMUNITY): Payer: Self-pay | Admitting: Internal Medicine

## 2012-12-21 DIAGNOSIS — Z09 Encounter for follow-up examination after completed treatment for conditions other than malignant neoplasm: Secondary | ICD-10-CM | POA: Insufficient documentation

## 2012-12-21 DIAGNOSIS — J189 Pneumonia, unspecified organism: Secondary | ICD-10-CM

## 2013-03-17 ENCOUNTER — Encounter: Payer: Self-pay | Admitting: Gastroenterology

## 2013-07-31 ENCOUNTER — Emergency Department (HOSPITAL_COMMUNITY)
Admission: EM | Admit: 2013-07-31 | Discharge: 2013-07-31 | Disposition: A | Discharge status: Home / Self Care | Payer: Medicare Other | Attending: Emergency Medicine | Admitting: Emergency Medicine

## 2013-07-31 ENCOUNTER — Encounter (HOSPITAL_COMMUNITY): Payer: Self-pay | Admitting: Emergency Medicine

## 2013-07-31 ENCOUNTER — Emergency Department (HOSPITAL_COMMUNITY): Payer: Medicare Other

## 2013-07-31 DIAGNOSIS — J4489 Other specified chronic obstructive pulmonary disease: Secondary | ICD-10-CM | POA: Insufficient documentation

## 2013-07-31 DIAGNOSIS — R509 Fever, unspecified: Secondary | ICD-10-CM | POA: Insufficient documentation

## 2013-07-31 DIAGNOSIS — J449 Chronic obstructive pulmonary disease, unspecified: Secondary | ICD-10-CM | POA: Insufficient documentation

## 2013-07-31 DIAGNOSIS — E079 Disorder of thyroid, unspecified: Secondary | ICD-10-CM | POA: Insufficient documentation

## 2013-07-31 DIAGNOSIS — Z792 Long term (current) use of antibiotics: Secondary | ICD-10-CM | POA: Insufficient documentation

## 2013-07-31 DIAGNOSIS — R443 Hallucinations, unspecified: Secondary | ICD-10-CM | POA: Insufficient documentation

## 2013-07-31 DIAGNOSIS — B349 Viral infection, unspecified: Secondary | ICD-10-CM

## 2013-07-31 DIAGNOSIS — IMO0002 Reserved for concepts with insufficient information to code with codable children: Secondary | ICD-10-CM | POA: Insufficient documentation

## 2013-07-31 DIAGNOSIS — Z79899 Other long term (current) drug therapy: Secondary | ICD-10-CM | POA: Insufficient documentation

## 2013-07-31 DIAGNOSIS — I10 Essential (primary) hypertension: Secondary | ICD-10-CM | POA: Insufficient documentation

## 2013-07-31 DIAGNOSIS — B9789 Other viral agents as the cause of diseases classified elsewhere: Secondary | ICD-10-CM | POA: Insufficient documentation

## 2013-07-31 DIAGNOSIS — E7800 Pure hypercholesterolemia, unspecified: Secondary | ICD-10-CM | POA: Insufficient documentation

## 2013-07-31 LAB — COMPREHENSIVE METABOLIC PANEL
Alkaline Phosphatase: 61 U/L (ref 39–117)
BUN: 52 mg/dL — ABNORMAL HIGH (ref 6–23)
CO2: 25 mEq/L (ref 19–32)
Chloride: 95 mEq/L — ABNORMAL LOW (ref 96–112)
GFR calc Af Amer: 25 mL/min — ABNORMAL LOW (ref >90)
GFR calc non Af Amer: 22 mL/min — ABNORMAL LOW (ref >90)
Glucose, Bld: 106 mg/dL — ABNORMAL HIGH (ref 70–99)
Potassium: 3.3 mEq/L — ABNORMAL LOW (ref 3.5–5.1)
Total Bilirubin: 0.3 mg/dL (ref 0.3–1.2)

## 2013-07-31 LAB — CBC WITH DIFFERENTIAL/PLATELET
Eosinophils Absolute: 0.1 10*3/uL (ref 0.0–0.7)
Hemoglobin: 10.2 g/dL — ABNORMAL LOW (ref 12.0–15.0)
Lymphs Abs: 2.6 10*3/uL (ref 0.7–4.0)
MCH: 27.9 pg (ref 26.0–34.0)
Monocytes Relative: 9 % (ref 3–12)
Neutro Abs: 7.6 10*3/uL (ref 1.7–7.7)
Neutrophils Relative %: 67 % (ref 43–77)
RBC: 3.66 MIL/uL — ABNORMAL LOW (ref 3.87–5.11)

## 2013-07-31 LAB — URINALYSIS, ROUTINE W REFLEX MICROSCOPIC
Ketones, ur: NEGATIVE mg/dL
Leukocytes, UA: NEGATIVE
Nitrite: NEGATIVE
Protein, ur: NEGATIVE mg/dL

## 2013-07-31 NOTE — ED Notes (Addendum)
Pt has had fever, increased weakness, fatigue and hallucinations over past few days.  Also reporting tenderness in lower back.

## 2013-07-31 NOTE — ED Notes (Signed)
Up to bathroom and unable to provide urine specimen - MD advised to have patient drink po fluids until she can provide a urine specimen

## 2013-07-31 NOTE — ED Provider Notes (Signed)
CSN: [id number]     Arrival date & time 07/31/13  0235 History   First MD Initiated Contact with Patient 07/31/13 0244     Chief Complaint  Patient presents with  . Weakness  . Fever  . Hallucinations   (Consider location/radiation/quality/duration/timing/severity/associated sxs/prior Treatment) HPI...., weakness, fatigue for one week. Seen by her primary care doctor twice and ultimately prescribed Levaquin for a cough. No dysuria, stiff neck, chills, rusty sputum. Severity is mild to moderate. Daughter reports some visual hallucinations.  Past Medical History  Diagnosis Date  . Hypertension   . COPD (chronic obstructive pulmonary disease)   . Hypercholesterolemia   . Thyroid disease    Past Surgical History  Procedure Laterality Date  . Dilation and curettage of uterus    . Abdominal hysterectomy    . Nephrostomy tube     History reviewed. No pertinent family history. History  Substance Use Topics  . Smoking status: Never Smoker   . Smokeless tobacco: Not on file  . Alcohol Use: No   OB History   Grav Para Term Preterm Abortions TAB SAB Ect Mult Living                 Review of Systems  All other systems reviewed and are negative.    Allergies  Review of patient's allergies indicates no known allergies.  Home Medications   Current Outpatient Rx  Name  Route  Sig  Dispense  Refill  . albuterol (PROVENTIL HFA;VENTOLIN HFA) 108 (90 BASE) MCG/ACT inhaler   Inhalation   Inhale 2 puffs into the lungs every 4 (four) hours as needed for wheezing. For shortness of breath         . cetirizine (ZYRTEC) 10 MG tablet   Oral   Take 10 mg by mouth daily.         . fenofibrate micronized (LOFIBRA) 134 MG capsule   Oral   Take 134 mg by mouth daily.         Marland Kitchen HYDROcodone-homatropine (HYDROMET) 5-1.5 MG/5ML syrup   Oral   Take 5 mLs by mouth every 6 (six) hours as needed. For cough         . levofloxacin (LEVAQUIN) 500 MG tablet   Oral   Take 500 mg by mouth  daily.         Marland Kitchen levothyroxine (SYNTHROID, LEVOTHROID) 50 MCG tablet   Oral   Take 75 mcg by mouth every morning.          . metoprolol tartrate (LOPRESSOR) 25 MG tablet   Oral   Take 25 mg by mouth Nightly. At bedtime         . rosuvastatin (CRESTOR) 20 MG tablet   Oral   Take 20 mg by mouth every evening.         . valsartan-hydrochlorothiazide (DIOVAN-HCT) 320-12.5 MG per tablet   Oral   Take 1 tablet by mouth daily.         Marland Kitchen acetaminophen (TYLENOL) 325 MG tablet   Oral   Take 2 tablets (650 mg total) by mouth every 6 (six) hours as needed (or Fever >/= 101).         Marland Kitchen amLODipine (NORVASC) 5 MG tablet   Oral   Take 5 mg by mouth daily.         . cefUROXime (CEFTIN) 500 MG tablet   Oral   Take 1 tablet (500 mg total) by mouth 2 (two) times daily with a meal.   14  tablet   0   . montelukast (SINGULAIR) 10 MG tablet   Oral   Take 10 mg by mouth daily.         Marland Kitchen nystatin (MYCOSTATIN) 100000 UNIT/ML suspension   Oral   Take 5 mLs (500,000 Units total) by mouth 4 (four) times daily. For thrush/mouth pain   60 mL   0   . predniSONE (STERAPRED UNI-PAK) 10 MG tablet   Oral   Take 10-60 mg by mouth daily. For 6 days. (6,5,4,3,2,1, STOP)          BP 110/45  Pulse 93  Temp(Src) 98.8 F (37.1 C) (Oral)  Resp 18  Ht 5\' 4"  (1.626 m)  Wt 163 lb (73.936 kg)  BMI 27.97 kg/m2  SpO2 94% Physical Exam  Nursing note and vitals reviewed. Constitutional: She is oriented to person, place, and time. She appears well-developed and well-nourished.  Pleasant, nontoxic, good color  HENT:  Head: Normocephalic and atraumatic.  Eyes: Conjunctivae and EOM are normal. Pupils are equal, round, and reactive to light.  Neck: Normal range of motion. Neck supple.  Cardiovascular: Normal rate, regular rhythm and normal heart sounds.   Pulmonary/Chest: Effort normal and breath sounds normal.  Abdominal: Soft. Bowel sounds are normal.  Musculoskeletal: Normal range of  motion.  Neurological: She is alert and oriented to person, place, and time.  Skin: Skin is warm and dry.  Psychiatric:  No obvious visual or auditory hallucinations    ED Course  Procedures (including critical care time) Labs Review Labs Reviewed  CBC WITH DIFFERENTIAL - Abnormal; Notable for the following:    WBC 11.4 (*)    RBC 3.66 (*)    Hemoglobin 10.2 (*)    HCT 30.4 (*)    All other components within normal limits  COMPREHENSIVE METABOLIC PANEL - Abnormal; Notable for the following:    Sodium 134 (*)    Potassium 3.3 (*)    Chloride 95 (*)    Glucose, Bld 106 (*)    BUN 52 (*)    Creatinine, Ser 2.06 (*)    Albumin 3.0 (*)    GFR calc non Af Amer 22 (*)    GFR calc Af Amer 25 (*)    All other components within normal limits  URINALYSIS, ROUTINE W REFLEX MICROSCOPIC   Imaging Review Dg Chest 2 View  07/31/2013   CLINICAL DATA:  Fever  EXAM: CHEST  2 VIEW  COMPARISON:  Prior radiograph from 12/21/2012  FINDINGS: The cardiac and mediastinal silhouettes are stable in size and contour. Mild cardiomegaly is unchanged. Hiatal hernia is noted.  Lungs are normally inflated. Diffuse prominence of the interstitial markings is similar as compared to prior exam. Mild right basilar atelectasis/ scarring is unchanged. No definite focal infiltrate identified. No pulmonary edema or pleural effusion. No pneumothorax.  Multilevel degenerative changes are noted within the visualized spine. No acute fracture or other osseous abnormality identified  IMPRESSION: Stable appearance of the chest with persistent mild right basilar atelectasis or scarring. No focal infiltrate to suggest acute infectious pneumonitis identified.  Stable mild cardiomegaly without pulmonary edema.  Hiatal hernia   Electronically Signed   By: Rise Mu M.D.   On: 07/31/2013 04:13    EKG Interpretation   None       MDM   1. Viral syndrome    Urinalysis and chest x-ray show no obvious infection. Suspect  a viral syndrome. No clinical evidence of meningitis. Patient is on Levaquin per her primary  care physician. She will be discharged under the care of her daughter.    Donnetta Hutching, MD 07/31/13 [phone]

## 2013-07-31 NOTE — ED Notes (Addendum)
Daughter and grand daughter Banker) state patient has been having intermittant episodes of confusion, hallucinations - seeing animals that are not there, flashing lights,   Seen by her MD this week and started on antibiotic- has experienced varying degrees of elevated temperature, poor po intake, lack of energy.  Also has cough.  Hx of pneumonia in past

## 2013-09-06 ENCOUNTER — Other Ambulatory Visit: Payer: Self-pay | Admitting: Obstetrics and Gynecology

## 2013-09-06 DIAGNOSIS — Z139 Encounter for screening, unspecified: Secondary | ICD-10-CM

## 2013-09-13 ENCOUNTER — Ambulatory Visit (HOSPITAL_COMMUNITY): Payer: Medicare Other

## 2013-09-14 ENCOUNTER — Ambulatory Visit (HOSPITAL_COMMUNITY)
Admission: RE | Admit: 2013-09-14 | Discharge: 2013-09-14 | Disposition: A | Discharge status: Home / Self Care | Payer: Medicare Other | Source: Ambulatory Visit | Attending: Obstetrics and Gynecology | Admitting: Obstetrics and Gynecology

## 2013-09-14 DIAGNOSIS — Z1231 Encounter for screening mammogram for malignant neoplasm of breast: Secondary | ICD-10-CM | POA: Insufficient documentation

## 2013-09-14 DIAGNOSIS — Z139 Encounter for screening, unspecified: Secondary | ICD-10-CM

## 2014-09-05 ENCOUNTER — Other Ambulatory Visit: Payer: Self-pay | Admitting: Obstetrics and Gynecology

## 2014-09-05 DIAGNOSIS — Z1231 Encounter for screening mammogram for malignant neoplasm of breast: Secondary | ICD-10-CM

## 2014-09-16 ENCOUNTER — Ambulatory Visit (HOSPITAL_COMMUNITY)
Admission: RE | Admit: 2014-09-16 | Discharge: 2014-09-16 | Disposition: A | Discharge status: Home / Self Care | Payer: Medicare Other | Source: Ambulatory Visit | Attending: Obstetrics and Gynecology | Admitting: Obstetrics and Gynecology

## 2014-09-16 DIAGNOSIS — Z1231 Encounter for screening mammogram for malignant neoplasm of breast: Secondary | ICD-10-CM | POA: Diagnosis present

## 2015-10-10 DIAGNOSIS — E039 Hypothyroidism, unspecified: Secondary | ICD-10-CM | POA: Diagnosis not present

## 2015-10-10 DIAGNOSIS — R7301 Impaired fasting glucose: Secondary | ICD-10-CM | POA: Diagnosis not present

## 2015-10-10 DIAGNOSIS — I10 Essential (primary) hypertension: Secondary | ICD-10-CM | POA: Diagnosis not present

## 2015-10-17 DIAGNOSIS — R944 Abnormal results of kidney function studies: Secondary | ICD-10-CM | POA: Diagnosis not present

## 2015-10-17 DIAGNOSIS — I10 Essential (primary) hypertension: Secondary | ICD-10-CM | POA: Diagnosis not present

## 2015-10-17 DIAGNOSIS — E039 Hypothyroidism, unspecified: Secondary | ICD-10-CM | POA: Diagnosis not present

## 2015-10-17 DIAGNOSIS — E782 Mixed hyperlipidemia: Secondary | ICD-10-CM | POA: Diagnosis not present

## 2015-10-17 DIAGNOSIS — D509 Iron deficiency anemia, unspecified: Secondary | ICD-10-CM | POA: Diagnosis not present

## 2015-10-17 DIAGNOSIS — R7301 Impaired fasting glucose: Secondary | ICD-10-CM | POA: Diagnosis not present

## 2015-12-05 ENCOUNTER — Other Ambulatory Visit: Payer: Self-pay | Admitting: Obstetrics and Gynecology

## 2015-12-05 DIAGNOSIS — Z1231 Encounter for screening mammogram for malignant neoplasm of breast: Secondary | ICD-10-CM

## 2015-12-11 ENCOUNTER — Ambulatory Visit (HOSPITAL_COMMUNITY): Payer: Medicare Other

## 2016-02-13 DIAGNOSIS — Z9842 Cataract extraction status, left eye: Secondary | ICD-10-CM | POA: Diagnosis not present

## 2016-02-13 DIAGNOSIS — Z0101 Encounter for examination of eyes and vision with abnormal findings: Secondary | ICD-10-CM | POA: Diagnosis not present

## 2016-02-13 DIAGNOSIS — H26491 Other secondary cataract, right eye: Secondary | ICD-10-CM | POA: Diagnosis not present

## 2016-02-13 DIAGNOSIS — Z961 Presence of intraocular lens: Secondary | ICD-10-CM | POA: Diagnosis not present

## 2016-02-26 DIAGNOSIS — H26491 Other secondary cataract, right eye: Secondary | ICD-10-CM | POA: Diagnosis not present

## 2016-02-26 DIAGNOSIS — H35372 Puckering of macula, left eye: Secondary | ICD-10-CM | POA: Diagnosis not present

## 2016-02-26 DIAGNOSIS — Z961 Presence of intraocular lens: Secondary | ICD-10-CM | POA: Diagnosis not present

## 2016-03-28 DIAGNOSIS — H35372 Puckering of macula, left eye: Secondary | ICD-10-CM | POA: Diagnosis not present

## 2016-03-28 DIAGNOSIS — Z961 Presence of intraocular lens: Secondary | ICD-10-CM | POA: Diagnosis not present

## 2016-03-28 DIAGNOSIS — H26491 Other secondary cataract, right eye: Secondary | ICD-10-CM | POA: Diagnosis not present

## 2016-05-06 DIAGNOSIS — D509 Iron deficiency anemia, unspecified: Secondary | ICD-10-CM | POA: Diagnosis not present

## 2016-05-06 DIAGNOSIS — E039 Hypothyroidism, unspecified: Secondary | ICD-10-CM | POA: Diagnosis not present

## 2016-05-06 DIAGNOSIS — R7301 Impaired fasting glucose: Secondary | ICD-10-CM | POA: Diagnosis not present

## 2016-05-06 DIAGNOSIS — E782 Mixed hyperlipidemia: Secondary | ICD-10-CM | POA: Diagnosis not present

## 2016-05-08 DIAGNOSIS — E782 Mixed hyperlipidemia: Secondary | ICD-10-CM | POA: Diagnosis not present

## 2016-05-08 DIAGNOSIS — E039 Hypothyroidism, unspecified: Secondary | ICD-10-CM | POA: Diagnosis not present

## 2016-05-08 DIAGNOSIS — R51 Headache: Secondary | ICD-10-CM | POA: Diagnosis not present

## 2016-05-08 DIAGNOSIS — D509 Iron deficiency anemia, unspecified: Secondary | ICD-10-CM | POA: Diagnosis not present

## 2016-05-08 DIAGNOSIS — I10 Essential (primary) hypertension: Secondary | ICD-10-CM | POA: Diagnosis not present

## 2016-05-08 DIAGNOSIS — R7301 Impaired fasting glucose: Secondary | ICD-10-CM | POA: Diagnosis not present

## 2016-05-08 DIAGNOSIS — N183 Chronic kidney disease, stage 3 (moderate): Secondary | ICD-10-CM | POA: Diagnosis not present

## 2016-07-02 DIAGNOSIS — Z23 Encounter for immunization: Secondary | ICD-10-CM | POA: Diagnosis not present

## 2016-09-06 DIAGNOSIS — Z0000 Encounter for general adult medical examination without abnormal findings: Secondary | ICD-10-CM | POA: Diagnosis not present

## 2016-11-20 DIAGNOSIS — D509 Iron deficiency anemia, unspecified: Secondary | ICD-10-CM | POA: Diagnosis not present

## 2016-11-20 DIAGNOSIS — E039 Hypothyroidism, unspecified: Secondary | ICD-10-CM | POA: Diagnosis not present

## 2016-11-20 DIAGNOSIS — R701 Abnormal plasma viscosity: Secondary | ICD-10-CM | POA: Diagnosis not present

## 2016-11-20 DIAGNOSIS — E782 Mixed hyperlipidemia: Secondary | ICD-10-CM | POA: Diagnosis not present

## 2016-11-22 DIAGNOSIS — E039 Hypothyroidism, unspecified: Secondary | ICD-10-CM | POA: Diagnosis not present

## 2016-11-22 DIAGNOSIS — I129 Hypertensive chronic kidney disease with stage 1 through stage 4 chronic kidney disease, or unspecified chronic kidney disease: Secondary | ICD-10-CM | POA: Diagnosis not present

## 2016-11-22 DIAGNOSIS — Z6826 Body mass index (BMI) 26.0-26.9, adult: Secondary | ICD-10-CM | POA: Diagnosis not present

## 2016-11-22 DIAGNOSIS — R7301 Impaired fasting glucose: Secondary | ICD-10-CM | POA: Diagnosis not present

## 2016-11-22 DIAGNOSIS — D509 Iron deficiency anemia, unspecified: Secondary | ICD-10-CM | POA: Diagnosis not present

## 2016-11-22 DIAGNOSIS — N183 Chronic kidney disease, stage 3 (moderate): Secondary | ICD-10-CM | POA: Diagnosis not present

## 2016-11-22 DIAGNOSIS — E782 Mixed hyperlipidemia: Secondary | ICD-10-CM | POA: Diagnosis not present

## 2017-05-26 DIAGNOSIS — Z6826 Body mass index (BMI) 26.0-26.9, adult: Secondary | ICD-10-CM | POA: Diagnosis not present

## 2017-05-26 DIAGNOSIS — R600 Localized edema: Secondary | ICD-10-CM | POA: Diagnosis not present

## 2017-05-26 DIAGNOSIS — I10 Essential (primary) hypertension: Secondary | ICD-10-CM | POA: Diagnosis not present

## 2017-05-26 DIAGNOSIS — M545 Low back pain: Secondary | ICD-10-CM | POA: Diagnosis not present

## 2017-07-17 DIAGNOSIS — R7301 Impaired fasting glucose: Secondary | ICD-10-CM | POA: Diagnosis not present

## 2017-07-17 DIAGNOSIS — E782 Mixed hyperlipidemia: Secondary | ICD-10-CM | POA: Diagnosis not present

## 2017-07-17 DIAGNOSIS — D509 Iron deficiency anemia, unspecified: Secondary | ICD-10-CM | POA: Diagnosis not present

## 2017-07-22 DIAGNOSIS — R7301 Impaired fasting glucose: Secondary | ICD-10-CM | POA: Diagnosis not present

## 2017-07-22 DIAGNOSIS — D509 Iron deficiency anemia, unspecified: Secondary | ICD-10-CM | POA: Diagnosis not present

## 2017-07-22 DIAGNOSIS — E039 Hypothyroidism, unspecified: Secondary | ICD-10-CM | POA: Diagnosis not present

## 2017-07-22 DIAGNOSIS — E782 Mixed hyperlipidemia: Secondary | ICD-10-CM | POA: Diagnosis not present

## 2017-07-22 DIAGNOSIS — I129 Hypertensive chronic kidney disease with stage 1 through stage 4 chronic kidney disease, or unspecified chronic kidney disease: Secondary | ICD-10-CM | POA: Diagnosis not present

## 2017-07-22 DIAGNOSIS — N183 Chronic kidney disease, stage 3 (moderate): Secondary | ICD-10-CM | POA: Diagnosis not present

## 2017-07-28 DIAGNOSIS — Z23 Encounter for immunization: Secondary | ICD-10-CM | POA: Diagnosis not present

## 2017-12-17 DIAGNOSIS — E039 Hypothyroidism, unspecified: Secondary | ICD-10-CM | POA: Diagnosis not present

## 2017-12-17 DIAGNOSIS — R944 Abnormal results of kidney function studies: Secondary | ICD-10-CM | POA: Diagnosis not present

## 2017-12-17 DIAGNOSIS — K219 Gastro-esophageal reflux disease without esophagitis: Secondary | ICD-10-CM | POA: Diagnosis not present

## 2017-12-17 DIAGNOSIS — R7301 Impaired fasting glucose: Secondary | ICD-10-CM | POA: Diagnosis not present

## 2017-12-17 DIAGNOSIS — I129 Hypertensive chronic kidney disease with stage 1 through stage 4 chronic kidney disease, or unspecified chronic kidney disease: Secondary | ICD-10-CM | POA: Diagnosis not present

## 2017-12-17 DIAGNOSIS — E782 Mixed hyperlipidemia: Secondary | ICD-10-CM | POA: Diagnosis not present

## 2017-12-17 DIAGNOSIS — N183 Chronic kidney disease, stage 3 (moderate): Secondary | ICD-10-CM | POA: Diagnosis not present

## 2017-12-17 DIAGNOSIS — D509 Iron deficiency anemia, unspecified: Secondary | ICD-10-CM | POA: Diagnosis not present

## 2017-12-17 DIAGNOSIS — Z23 Encounter for immunization: Secondary | ICD-10-CM | POA: Diagnosis not present

## 2017-12-17 DIAGNOSIS — Z6828 Body mass index (BMI) 28.0-28.9, adult: Secondary | ICD-10-CM | POA: Diagnosis not present

## 2017-12-17 DIAGNOSIS — R600 Localized edema: Secondary | ICD-10-CM | POA: Diagnosis not present

## 2017-12-17 DIAGNOSIS — J449 Chronic obstructive pulmonary disease, unspecified: Secondary | ICD-10-CM | POA: Diagnosis not present

## 2018-01-16 DIAGNOSIS — E782 Mixed hyperlipidemia: Secondary | ICD-10-CM | POA: Diagnosis not present

## 2018-01-16 DIAGNOSIS — R7301 Impaired fasting glucose: Secondary | ICD-10-CM | POA: Diagnosis not present

## 2018-01-16 DIAGNOSIS — D509 Iron deficiency anemia, unspecified: Secondary | ICD-10-CM | POA: Diagnosis not present

## 2018-01-16 DIAGNOSIS — R600 Localized edema: Secondary | ICD-10-CM | POA: Diagnosis not present

## 2018-01-16 DIAGNOSIS — E039 Hypothyroidism, unspecified: Secondary | ICD-10-CM | POA: Diagnosis not present

## 2018-01-20 DIAGNOSIS — I129 Hypertensive chronic kidney disease with stage 1 through stage 4 chronic kidney disease, or unspecified chronic kidney disease: Secondary | ICD-10-CM | POA: Diagnosis not present

## 2018-01-20 DIAGNOSIS — R7301 Impaired fasting glucose: Secondary | ICD-10-CM | POA: Diagnosis not present

## 2018-01-20 DIAGNOSIS — E782 Mixed hyperlipidemia: Secondary | ICD-10-CM | POA: Diagnosis not present

## 2018-01-20 DIAGNOSIS — Z0001 Encounter for general adult medical examination with abnormal findings: Secondary | ICD-10-CM | POA: Diagnosis not present

## 2018-01-20 DIAGNOSIS — R945 Abnormal results of liver function studies: Secondary | ICD-10-CM | POA: Diagnosis not present

## 2018-01-20 DIAGNOSIS — D509 Iron deficiency anemia, unspecified: Secondary | ICD-10-CM | POA: Diagnosis not present

## 2018-01-20 DIAGNOSIS — N183 Chronic kidney disease, stage 3 (moderate): Secondary | ICD-10-CM | POA: Diagnosis not present

## 2018-01-20 DIAGNOSIS — E039 Hypothyroidism, unspecified: Secondary | ICD-10-CM | POA: Diagnosis not present

## 2018-01-20 DIAGNOSIS — K5900 Constipation, unspecified: Secondary | ICD-10-CM | POA: Diagnosis not present

## 2018-02-02 DIAGNOSIS — K5900 Constipation, unspecified: Secondary | ICD-10-CM | POA: Diagnosis not present

## 2018-02-02 DIAGNOSIS — Z6828 Body mass index (BMI) 28.0-28.9, adult: Secondary | ICD-10-CM | POA: Diagnosis not present

## 2018-02-02 DIAGNOSIS — R1310 Dysphagia, unspecified: Secondary | ICD-10-CM | POA: Diagnosis not present

## 2018-02-02 DIAGNOSIS — I129 Hypertensive chronic kidney disease with stage 1 through stage 4 chronic kidney disease, or unspecified chronic kidney disease: Secondary | ICD-10-CM | POA: Diagnosis not present

## 2018-02-02 DIAGNOSIS — E039 Hypothyroidism, unspecified: Secondary | ICD-10-CM | POA: Diagnosis not present

## 2018-05-12 DIAGNOSIS — E039 Hypothyroidism, unspecified: Secondary | ICD-10-CM | POA: Diagnosis not present

## 2018-05-12 DIAGNOSIS — K219 Gastro-esophageal reflux disease without esophagitis: Secondary | ICD-10-CM | POA: Diagnosis not present

## 2018-05-12 DIAGNOSIS — N183 Chronic kidney disease, stage 3 (moderate): Secondary | ICD-10-CM | POA: Diagnosis not present

## 2018-05-12 DIAGNOSIS — J449 Chronic obstructive pulmonary disease, unspecified: Secondary | ICD-10-CM | POA: Diagnosis not present

## 2018-05-12 DIAGNOSIS — I129 Hypertensive chronic kidney disease with stage 1 through stage 4 chronic kidney disease, or unspecified chronic kidney disease: Secondary | ICD-10-CM | POA: Diagnosis not present

## 2018-05-12 DIAGNOSIS — R600 Localized edema: Secondary | ICD-10-CM | POA: Diagnosis not present

## 2018-05-12 DIAGNOSIS — R945 Abnormal results of liver function studies: Secondary | ICD-10-CM | POA: Diagnosis not present

## 2018-05-12 DIAGNOSIS — E782 Mixed hyperlipidemia: Secondary | ICD-10-CM | POA: Diagnosis not present

## 2018-05-12 DIAGNOSIS — R1310 Dysphagia, unspecified: Secondary | ICD-10-CM | POA: Diagnosis not present

## 2018-05-12 DIAGNOSIS — R7301 Impaired fasting glucose: Secondary | ICD-10-CM | POA: Diagnosis not present

## 2018-05-12 DIAGNOSIS — R944 Abnormal results of kidney function studies: Secondary | ICD-10-CM | POA: Diagnosis not present

## 2018-06-18 DIAGNOSIS — R7301 Impaired fasting glucose: Secondary | ICD-10-CM | POA: Diagnosis not present

## 2018-06-18 DIAGNOSIS — J449 Chronic obstructive pulmonary disease, unspecified: Secondary | ICD-10-CM | POA: Diagnosis not present

## 2018-06-18 DIAGNOSIS — E039 Hypothyroidism, unspecified: Secondary | ICD-10-CM | POA: Diagnosis not present

## 2018-06-18 DIAGNOSIS — D509 Iron deficiency anemia, unspecified: Secondary | ICD-10-CM | POA: Diagnosis not present

## 2018-06-18 DIAGNOSIS — E782 Mixed hyperlipidemia: Secondary | ICD-10-CM | POA: Diagnosis not present

## 2018-06-18 DIAGNOSIS — I129 Hypertensive chronic kidney disease with stage 1 through stage 4 chronic kidney disease, or unspecified chronic kidney disease: Secondary | ICD-10-CM | POA: Diagnosis not present

## 2018-06-18 DIAGNOSIS — K219 Gastro-esophageal reflux disease without esophagitis: Secondary | ICD-10-CM | POA: Diagnosis not present

## 2018-06-18 DIAGNOSIS — N183 Chronic kidney disease, stage 3 (moderate): Secondary | ICD-10-CM | POA: Diagnosis not present

## 2018-06-18 DIAGNOSIS — R944 Abnormal results of kidney function studies: Secondary | ICD-10-CM | POA: Diagnosis not present

## 2018-07-01 DIAGNOSIS — Z23 Encounter for immunization: Secondary | ICD-10-CM | POA: Diagnosis not present

## 2018-07-06 DIAGNOSIS — E782 Mixed hyperlipidemia: Secondary | ICD-10-CM | POA: Diagnosis not present

## 2018-07-06 DIAGNOSIS — E039 Hypothyroidism, unspecified: Secondary | ICD-10-CM | POA: Diagnosis not present

## 2018-07-06 DIAGNOSIS — R7301 Impaired fasting glucose: Secondary | ICD-10-CM | POA: Diagnosis not present

## 2018-07-06 DIAGNOSIS — N183 Chronic kidney disease, stage 3 (moderate): Secondary | ICD-10-CM | POA: Diagnosis not present

## 2018-07-06 DIAGNOSIS — D509 Iron deficiency anemia, unspecified: Secondary | ICD-10-CM | POA: Diagnosis not present

## 2018-07-13 DIAGNOSIS — Z6828 Body mass index (BMI) 28.0-28.9, adult: Secondary | ICD-10-CM | POA: Diagnosis not present

## 2018-07-13 DIAGNOSIS — E782 Mixed hyperlipidemia: Secondary | ICD-10-CM | POA: Diagnosis not present

## 2018-07-13 DIAGNOSIS — R7301 Impaired fasting glucose: Secondary | ICD-10-CM | POA: Diagnosis not present

## 2018-07-13 DIAGNOSIS — E039 Hypothyroidism, unspecified: Secondary | ICD-10-CM | POA: Diagnosis not present

## 2018-07-13 DIAGNOSIS — I10 Essential (primary) hypertension: Secondary | ICD-10-CM | POA: Diagnosis not present

## 2018-07-13 DIAGNOSIS — R7303 Prediabetes: Secondary | ICD-10-CM | POA: Diagnosis not present

## 2018-07-20 DIAGNOSIS — I129 Hypertensive chronic kidney disease with stage 1 through stage 4 chronic kidney disease, or unspecified chronic kidney disease: Secondary | ICD-10-CM | POA: Diagnosis not present

## 2018-07-20 DIAGNOSIS — I10 Essential (primary) hypertension: Secondary | ICD-10-CM | POA: Diagnosis not present

## 2018-08-14 DIAGNOSIS — E039 Hypothyroidism, unspecified: Secondary | ICD-10-CM | POA: Diagnosis not present

## 2018-08-14 DIAGNOSIS — I10 Essential (primary) hypertension: Secondary | ICD-10-CM | POA: Diagnosis not present

## 2018-08-14 DIAGNOSIS — E782 Mixed hyperlipidemia: Secondary | ICD-10-CM | POA: Diagnosis not present

## 2018-09-04 DIAGNOSIS — I10 Essential (primary) hypertension: Secondary | ICD-10-CM | POA: Diagnosis not present

## 2018-09-04 DIAGNOSIS — J449 Chronic obstructive pulmonary disease, unspecified: Secondary | ICD-10-CM | POA: Diagnosis not present

## 2018-10-07 DIAGNOSIS — E039 Hypothyroidism, unspecified: Secondary | ICD-10-CM | POA: Diagnosis not present

## 2018-10-07 DIAGNOSIS — R944 Abnormal results of kidney function studies: Secondary | ICD-10-CM | POA: Diagnosis not present

## 2018-10-07 DIAGNOSIS — E782 Mixed hyperlipidemia: Secondary | ICD-10-CM | POA: Diagnosis not present

## 2018-10-07 DIAGNOSIS — K219 Gastro-esophageal reflux disease without esophagitis: Secondary | ICD-10-CM | POA: Diagnosis not present

## 2018-10-07 DIAGNOSIS — R7301 Impaired fasting glucose: Secondary | ICD-10-CM | POA: Diagnosis not present

## 2018-10-07 DIAGNOSIS — N183 Chronic kidney disease, stage 3 (moderate): Secondary | ICD-10-CM | POA: Diagnosis not present

## 2018-10-07 DIAGNOSIS — J449 Chronic obstructive pulmonary disease, unspecified: Secondary | ICD-10-CM | POA: Diagnosis not present

## 2018-10-07 DIAGNOSIS — I129 Hypertensive chronic kidney disease with stage 1 through stage 4 chronic kidney disease, or unspecified chronic kidney disease: Secondary | ICD-10-CM | POA: Diagnosis not present

## 2018-10-12 DIAGNOSIS — L600 Ingrowing nail: Secondary | ICD-10-CM | POA: Diagnosis not present

## 2018-11-03 DIAGNOSIS — M2042 Other hammer toe(s) (acquired), left foot: Secondary | ICD-10-CM | POA: Diagnosis not present

## 2018-11-03 DIAGNOSIS — M79675 Pain in left toe(s): Secondary | ICD-10-CM | POA: Diagnosis not present

## 2018-11-03 DIAGNOSIS — B351 Tinea unguium: Secondary | ICD-10-CM | POA: Diagnosis not present

## 2018-11-03 DIAGNOSIS — L851 Acquired keratosis [keratoderma] palmaris et plantaris: Secondary | ICD-10-CM | POA: Diagnosis not present

## 2018-11-25 DIAGNOSIS — E782 Mixed hyperlipidemia: Secondary | ICD-10-CM | POA: Diagnosis not present

## 2018-11-25 DIAGNOSIS — J449 Chronic obstructive pulmonary disease, unspecified: Secondary | ICD-10-CM | POA: Diagnosis not present

## 2018-12-16 DIAGNOSIS — J449 Chronic obstructive pulmonary disease, unspecified: Secondary | ICD-10-CM | POA: Diagnosis not present

## 2018-12-16 DIAGNOSIS — E782 Mixed hyperlipidemia: Secondary | ICD-10-CM | POA: Diagnosis not present

## 2019-01-13 DIAGNOSIS — J449 Chronic obstructive pulmonary disease, unspecified: Secondary | ICD-10-CM | POA: Diagnosis not present

## 2019-01-13 DIAGNOSIS — K219 Gastro-esophageal reflux disease without esophagitis: Secondary | ICD-10-CM | POA: Diagnosis not present

## 2019-01-13 DIAGNOSIS — I129 Hypertensive chronic kidney disease with stage 1 through stage 4 chronic kidney disease, or unspecified chronic kidney disease: Secondary | ICD-10-CM | POA: Diagnosis not present

## 2019-01-13 DIAGNOSIS — N183 Chronic kidney disease, stage 3 (moderate): Secondary | ICD-10-CM | POA: Diagnosis not present

## 2019-01-13 DIAGNOSIS — E782 Mixed hyperlipidemia: Secondary | ICD-10-CM | POA: Diagnosis not present

## 2019-01-13 DIAGNOSIS — E039 Hypothyroidism, unspecified: Secondary | ICD-10-CM | POA: Diagnosis not present

## 2019-01-18 DIAGNOSIS — E039 Hypothyroidism, unspecified: Secondary | ICD-10-CM | POA: Diagnosis not present

## 2019-01-18 DIAGNOSIS — R7303 Prediabetes: Secondary | ICD-10-CM | POA: Diagnosis not present

## 2019-01-18 DIAGNOSIS — I10 Essential (primary) hypertension: Secondary | ICD-10-CM | POA: Diagnosis not present

## 2019-01-18 DIAGNOSIS — E782 Mixed hyperlipidemia: Secondary | ICD-10-CM | POA: Diagnosis not present

## 2019-01-18 DIAGNOSIS — R7301 Impaired fasting glucose: Secondary | ICD-10-CM | POA: Diagnosis not present

## 2019-01-26 DIAGNOSIS — Z0000 Encounter for general adult medical examination without abnormal findings: Secondary | ICD-10-CM | POA: Diagnosis not present

## 2019-02-09 DIAGNOSIS — L851 Acquired keratosis [keratoderma] palmaris et plantaris: Secondary | ICD-10-CM | POA: Diagnosis not present

## 2019-02-09 DIAGNOSIS — M2042 Other hammer toe(s) (acquired), left foot: Secondary | ICD-10-CM | POA: Diagnosis not present

## 2019-02-09 DIAGNOSIS — B351 Tinea unguium: Secondary | ICD-10-CM | POA: Diagnosis not present

## 2019-02-15 DIAGNOSIS — E782 Mixed hyperlipidemia: Secondary | ICD-10-CM | POA: Diagnosis not present

## 2019-02-15 DIAGNOSIS — R7301 Impaired fasting glucose: Secondary | ICD-10-CM | POA: Diagnosis not present

## 2019-02-15 DIAGNOSIS — E039 Hypothyroidism, unspecified: Secondary | ICD-10-CM | POA: Diagnosis not present

## 2019-02-15 DIAGNOSIS — I10 Essential (primary) hypertension: Secondary | ICD-10-CM | POA: Diagnosis not present

## 2019-03-18 DIAGNOSIS — E039 Hypothyroidism, unspecified: Secondary | ICD-10-CM | POA: Diagnosis not present

## 2019-03-18 DIAGNOSIS — R7301 Impaired fasting glucose: Secondary | ICD-10-CM | POA: Diagnosis not present

## 2019-03-18 DIAGNOSIS — I10 Essential (primary) hypertension: Secondary | ICD-10-CM | POA: Diagnosis not present

## 2019-03-18 DIAGNOSIS — E782 Mixed hyperlipidemia: Secondary | ICD-10-CM | POA: Diagnosis not present

## 2019-04-07 DIAGNOSIS — E039 Hypothyroidism, unspecified: Secondary | ICD-10-CM | POA: Diagnosis not present

## 2019-04-07 DIAGNOSIS — E782 Mixed hyperlipidemia: Secondary | ICD-10-CM | POA: Diagnosis not present

## 2019-04-07 DIAGNOSIS — I10 Essential (primary) hypertension: Secondary | ICD-10-CM | POA: Diagnosis not present

## 2019-04-07 DIAGNOSIS — R7301 Impaired fasting glucose: Secondary | ICD-10-CM | POA: Diagnosis not present

## 2019-05-11 DIAGNOSIS — B351 Tinea unguium: Secondary | ICD-10-CM | POA: Diagnosis not present

## 2019-05-11 DIAGNOSIS — M2042 Other hammer toe(s) (acquired), left foot: Secondary | ICD-10-CM | POA: Diagnosis not present

## 2019-05-11 DIAGNOSIS — L851 Acquired keratosis [keratoderma] palmaris et plantaris: Secondary | ICD-10-CM | POA: Diagnosis not present

## 2019-05-13 DIAGNOSIS — R7301 Impaired fasting glucose: Secondary | ICD-10-CM | POA: Diagnosis not present

## 2019-05-13 DIAGNOSIS — I10 Essential (primary) hypertension: Secondary | ICD-10-CM | POA: Diagnosis not present

## 2019-05-13 DIAGNOSIS — E039 Hypothyroidism, unspecified: Secondary | ICD-10-CM | POA: Diagnosis not present

## 2019-05-13 DIAGNOSIS — E782 Mixed hyperlipidemia: Secondary | ICD-10-CM | POA: Diagnosis not present

## 2019-06-03 DIAGNOSIS — I10 Essential (primary) hypertension: Secondary | ICD-10-CM | POA: Diagnosis not present

## 2019-06-03 DIAGNOSIS — E039 Hypothyroidism, unspecified: Secondary | ICD-10-CM | POA: Diagnosis not present

## 2019-06-03 DIAGNOSIS — I129 Hypertensive chronic kidney disease with stage 1 through stage 4 chronic kidney disease, or unspecified chronic kidney disease: Secondary | ICD-10-CM | POA: Diagnosis not present

## 2019-06-03 DIAGNOSIS — D509 Iron deficiency anemia, unspecified: Secondary | ICD-10-CM | POA: Diagnosis not present

## 2019-06-03 DIAGNOSIS — R7301 Impaired fasting glucose: Secondary | ICD-10-CM | POA: Diagnosis not present

## 2019-06-03 DIAGNOSIS — E782 Mixed hyperlipidemia: Secondary | ICD-10-CM | POA: Diagnosis not present

## 2019-06-03 DIAGNOSIS — R7303 Prediabetes: Secondary | ICD-10-CM | POA: Diagnosis not present

## 2019-06-08 DIAGNOSIS — Z6828 Body mass index (BMI) 28.0-28.9, adult: Secondary | ICD-10-CM | POA: Diagnosis not present

## 2019-06-08 DIAGNOSIS — E039 Hypothyroidism, unspecified: Secondary | ICD-10-CM | POA: Diagnosis not present

## 2019-06-08 DIAGNOSIS — I10 Essential (primary) hypertension: Secondary | ICD-10-CM | POA: Diagnosis not present

## 2019-06-08 DIAGNOSIS — R7303 Prediabetes: Secondary | ICD-10-CM | POA: Diagnosis not present

## 2019-06-08 DIAGNOSIS — R600 Localized edema: Secondary | ICD-10-CM | POA: Diagnosis not present

## 2019-06-08 DIAGNOSIS — E782 Mixed hyperlipidemia: Secondary | ICD-10-CM | POA: Diagnosis not present

## 2019-06-08 DIAGNOSIS — R7301 Impaired fasting glucose: Secondary | ICD-10-CM | POA: Diagnosis not present

## 2019-06-10 DIAGNOSIS — Z23 Encounter for immunization: Secondary | ICD-10-CM | POA: Diagnosis not present

## 2019-06-29 ENCOUNTER — Telehealth: Payer: Self-pay | Admitting: Adult Health

## 2019-06-29 NOTE — Telephone Encounter (Signed)
Called patient regarding appointment scheduled in our office encouraged to come alone to the visit if possible, however, a support person, over age 83, may accompany her  to appointment if assistance is needed for safety or care concerns. Otherwise, support persons should remain outside until the visit is complete.  ° °We ask if you have had any exposure to anyone suspected or confirmed of having COVID-19 or if you are experiencing any of the following, to call and reschedule your appointment: fever, cough, shortness of breath, muscle pain, diarrhea, rash, vomiting, abdominal pain, red eye, weakness, bruising, bleeding, joint pain, or a severe headache.  ° °Please know we will ask you these questions or similar questions when you arrive for your appointment and again it’s how we are keeping everyone safe.   ° °Also,to keep you safe, please use the provided hand sanitizer when you enter the office. We are asking everyone in the office to wear a mask to help prevent the spread of °germs. If you have a mask of your own, please wear it to your appointment, if not, we are happy to provide one for you. ° °Thank you for understanding and your cooperation.  ° ° °[facility] Staff ° ° ° °

## 2019-06-30 ENCOUNTER — Encounter: Payer: Self-pay | Admitting: Adult Health

## 2019-06-30 ENCOUNTER — Other Ambulatory Visit: Payer: Self-pay

## 2019-06-30 ENCOUNTER — Ambulatory Visit (INDEPENDENT_AMBULATORY_CARE_PROVIDER_SITE_OTHER): Payer: PPO | Admitting: Adult Health

## 2019-06-30 VITALS — BP 137/71 | HR 70 | Ht 64.0 in | Wt 166.0 lb

## 2019-06-30 DIAGNOSIS — L900 Lichen sclerosus et atrophicus: Secondary | ICD-10-CM | POA: Insufficient documentation

## 2019-06-30 DIAGNOSIS — L292 Pruritus vulvae: Secondary | ICD-10-CM | POA: Diagnosis not present

## 2019-06-30 MED ORDER — CLOBETASOL PROPIONATE 0.05 % EX CREA: TOPICAL_CREAM | CUTANEOUS | 3 refills | Status: DC

## 2019-06-30 NOTE — Patient Instructions (Signed)
Lichen Sclerosus Lichen sclerosus is a skin problem. It can happen on any part of the body. It happens most often in the anal or genital areas. It can cause itching and discomfort. Treatment can help to control symptoms. It can also help prevent scarring that may lead to other problems. What are the causes? The cause of this condition is not known. It is not passed from one person to another (not contagious). What increases the risk? This condition is more likely to develop in women. It most often occurs after menopause. What are the signs or symptoms? Symptoms of this condition include:  Thin, wrinkled, white areas on the skin.  Thickened white areas on the skin.  Red and swollen patches (lesions) on the skin.  Tears or cracks in the skin.  Bruising.  Blood blisters.  Very bad itching.  Pain, itching, or burning when peeing (urinating).  Trouble pooping (constipation). How is this diagnosed? This condition may be diagnosed with a physical exam. A sample of your skin may also be removed to be looked at under a microscope (biopsy). How is this treated? This condition may be treated with:  Creams or ointments (topical steroids) that are put on the skin in the affected areas. This is the most common treatment.  Medicines that are taken by mouth.  Surgery. This is only needed if the condition is very bad and is causing problems such as scarring. Follow these instructions at home:  Take or use over-the-counter and prescription medicines only as told by your doctor.  Use creams or ointments as told by your doctor.  Do not scratch the affected areas of skin.  If you are a woman, keep the vagina as clean and dry as you can.  Clean the affected area of skin gently with water. Avoid using rough towels or toilet paper.  Keep all follow-up visits as told by your doctor. This is important. Contact a doctor if:  Your redness, swelling, or pain gets worse.  You have fluid,  blood, or pus coming from the area.  You have new red and swollen patches on your skin.  You have a fever.  You have pain during sex. Summary  Lichen sclerosus is a skin problem. It can cause itching and discomfort.  This condition is usually treated with creams or ointments that are put on the skin in the affected areas.  Use medicines only as told by your doctor.  Do not scratch the affected areas of skin.  Keep all follow-up visits as told by your doctor. This is important. This information is not intended to replace advice given to you by your health care provider. Make sure you discuss any questions you have with your health care provider. Document Released: 08/29/2008 Document Revised: 01/29/2018 Document Reviewed: 01/29/2018 Elsevier Patient Education  2020 [employer]  

## 2019-06-30 NOTE — Progress Notes (Signed)
Patient ID: [name], female   DOB: [birth year], 85 y.o.   MRN: [id number] History of Present Illness: Creta is a 85 year old white female, widowed, sp hysterectomy in complaining of vulva itching and burning for about a month. She lives alone and still drives. PCP is Dr Nevada Crane   Current Medications, Allergies, Past Medical History, Past Surgical History, Family History and Social History were reviewed in [facility] record.     Review of Systems: Vulva  itching and burning    Physical Exam:BP 137/71 (BP Location: Right Arm, Patient Position: Sitting, Cuff Size: Normal)   Pulse 70   Ht 5\' 4"  (1.626 m)   Wt 166 lb (75.3 kg)   BMI 28.49 kg/m  General:  Well developed, well nourished, no acute distress Skin:  Warm and dry Pelvic:  External genitalia has area of white patches and, thin skin, and also some areas that are slightly red, extends from clitoris  to rectal area.  The vagina is atrophic. Urethra has no lesions or masses. The cervix and uterus are absent.  No adnexal masses or tenderness noted.Bladder is non tender, no masses felt. Psych:  No mood changes, alert and cooperative,seems happy Fall risk is low. Examination chaperoned by Diona Fanti CMA.   Impression and Plan: 1. Lichen sclerosus et atrophicus -given handout on lichen sclerous  -will rx temovate Meds ordered this encounter  Medications  . clobetasol cream (TEMOVATE) 0.05 %    Sig: Apply bid x 2 weeks then 2-3 x weekly    Dispense:  45 g    Refill:  3    Order Specific Question:   Supervising Provider    Answer:   Florian Buff [[id number]]  Discussed with her it is chronic condition -will recheck in 4 weeks   2. Vulvar itching -will rx temovate

## 2019-07-19 DIAGNOSIS — Z23 Encounter for immunization: Secondary | ICD-10-CM | POA: Diagnosis not present

## 2019-07-28 ENCOUNTER — Other Ambulatory Visit: Payer: Self-pay

## 2019-07-28 ENCOUNTER — Ambulatory Visit: Payer: PPO | Admitting: Adult Health

## 2019-07-28 ENCOUNTER — Encounter: Payer: Self-pay | Admitting: Adult Health

## 2019-07-28 VITALS — BP 143/80 | HR 73 | Ht 64.0 in | Wt 167.0 lb

## 2019-07-28 DIAGNOSIS — L900 Lichen sclerosus et atrophicus: Secondary | ICD-10-CM | POA: Diagnosis not present

## 2019-07-28 NOTE — Progress Notes (Signed)
  Subjective:     Patient ID: [name], female   DOB: 11-05-1930, 85 y.o.   MRN: [id number]  HPI [name] is a 85 year old white female, widowed, back in follow up on vulva itching and LSA, started using temovate and has no itching now, just skin color changes. PCP is Dr Nevada Crane.  Review of Systems No itching now Still has skin color changes  Reviewed past medical,surgical, social and family history. Reviewed medications and allergies.     Objective:   Physical Exam BP (!) 143/80 (BP Location: Left Arm, Patient Position: Sitting, Cuff Size: Normal)   Pulse 73   Ht 5\' 4"  (1.626 m)   Wt 167 lb (75.8 kg)   BMI 28.67 kg/m   Skin warm and dry. External genitalia still has white patches and thin skin, less redness. Discussed this is chronic, will continue temovate and recheck in 3 months, showed her pictures in [facility], and only 3% chance of any cancer.  Examination chaperoned by Weyman Croon FNP student.    Assessment:     1. Lichen sclerosus et atrophicus       Plan:     Continue temovate 2-3 x weekly to affected area Follow up in 3 months

## 2019-08-13 DIAGNOSIS — I10 Essential (primary) hypertension: Secondary | ICD-10-CM | POA: Diagnosis not present

## 2019-08-13 DIAGNOSIS — E782 Mixed hyperlipidemia: Secondary | ICD-10-CM | POA: Diagnosis not present

## 2019-08-13 DIAGNOSIS — R7301 Impaired fasting glucose: Secondary | ICD-10-CM | POA: Diagnosis not present

## 2019-08-13 DIAGNOSIS — E039 Hypothyroidism, unspecified: Secondary | ICD-10-CM | POA: Diagnosis not present

## 2019-09-20 DIAGNOSIS — R7301 Impaired fasting glucose: Secondary | ICD-10-CM | POA: Diagnosis not present

## 2019-09-20 DIAGNOSIS — E782 Mixed hyperlipidemia: Secondary | ICD-10-CM | POA: Diagnosis not present

## 2019-09-20 DIAGNOSIS — I10 Essential (primary) hypertension: Secondary | ICD-10-CM | POA: Diagnosis not present

## 2019-09-20 DIAGNOSIS — E039 Hypothyroidism, unspecified: Secondary | ICD-10-CM | POA: Diagnosis not present

## 2019-10-08 DIAGNOSIS — E039 Hypothyroidism, unspecified: Secondary | ICD-10-CM | POA: Diagnosis not present

## 2019-10-08 DIAGNOSIS — R7301 Impaired fasting glucose: Secondary | ICD-10-CM | POA: Diagnosis not present

## 2019-10-08 DIAGNOSIS — I10 Essential (primary) hypertension: Secondary | ICD-10-CM | POA: Diagnosis not present

## 2019-10-08 DIAGNOSIS — E782 Mixed hyperlipidemia: Secondary | ICD-10-CM | POA: Diagnosis not present

## 2019-10-25 ENCOUNTER — Telehealth: Payer: Self-pay | Admitting: Adult Health

## 2019-10-25 NOTE — Telephone Encounter (Signed)
Called patient regarding appointment scheduled in our office and advised to come alone to the visit, however, a support person, over age 84, may accompany her to appointment if assistance is needed for safety or care concerns. Otherwise, support persons should remain outside until the visit is complete.   Prescreen questions asked: 1. Any of the following symptoms of COVID such as chills, fever, cough, shortness of breath, muscle pain, diarrhea, rash, vomiting, abdominal pain, red eye, weakness, bruising, bleeding, joint pain, loss of taste or smell, a severe headache, sore throat, fatigue 2. Any exposure to anyone suspected or confirmed of having COVID-19 3. Awaiting test results for COVID-19  Also,to keep you safe, please use the provided hand sanitizer when you enter the office. We are asking everyone in the office to wear a mask to help prevent the spread of germs. If you have a mask of your own, please wear it to your appointment, if not, we are happy to provide one for you.  Thank you for understanding and your cooperation.    [facility] Staff      

## 2019-10-26 ENCOUNTER — Ambulatory Visit: Payer: PPO | Admitting: Adult Health

## 2019-10-26 ENCOUNTER — Other Ambulatory Visit: Payer: Self-pay

## 2019-10-26 ENCOUNTER — Encounter: Payer: Self-pay | Admitting: Adult Health

## 2019-10-26 VITALS — BP 161/84 | HR 75 | Ht 64.0 in | Wt 169.0 lb

## 2019-10-26 DIAGNOSIS — L900 Lichen sclerosus et atrophicus: Secondary | ICD-10-CM

## 2019-10-26 NOTE — Progress Notes (Signed)
  Subjective:     Patient ID: [name], female   DOB: Apr 03, 1931, 86 y.o.   MRN: [id number]  HPI [name] is a 86 year old white female, widowed and sp hysterectomy, back in follow up on vulva itching with LSA, and is much better, is using temovate. PCP is Dr Margo Aye.   Review of Systems She says vulva looks better and only has occasional itching now  Reviewed past medical,surgical, social and family history. Reviewed medications and allergies.     Objective:   Physical Exam BP (!) 161/84 (BP Location: Left Arm, Patient Position: Sitting, Cuff Size: Normal)   Pulse 75   Ht 5\' 4"  (1.626 m)   Wt 169 lb (76.7 kg)   BMI 29.01 kg/m    Skin warm and dry. External genitalis has no white patches now, and less redness, she does have sebaceous cyst right labia. Examination chaperoned by Rash, LPN.   Assessment:     1. Lichen sclerosus et atrophicus Continue temovate 2-3 x weekly    Plan:     Follow up prn

## 2019-11-16 DIAGNOSIS — E039 Hypothyroidism, unspecified: Secondary | ICD-10-CM | POA: Diagnosis not present

## 2019-11-16 DIAGNOSIS — E7849 Other hyperlipidemia: Secondary | ICD-10-CM | POA: Diagnosis not present

## 2019-11-16 DIAGNOSIS — R7301 Impaired fasting glucose: Secondary | ICD-10-CM | POA: Diagnosis not present

## 2019-11-16 DIAGNOSIS — I10 Essential (primary) hypertension: Secondary | ICD-10-CM | POA: Diagnosis not present

## 2019-12-07 DIAGNOSIS — J449 Chronic obstructive pulmonary disease, unspecified: Secondary | ICD-10-CM | POA: Diagnosis not present

## 2019-12-07 DIAGNOSIS — D509 Iron deficiency anemia, unspecified: Secondary | ICD-10-CM | POA: Diagnosis not present

## 2019-12-07 DIAGNOSIS — N183 Chronic kidney disease, stage 3 unspecified: Secondary | ICD-10-CM | POA: Diagnosis not present

## 2019-12-07 DIAGNOSIS — I129 Hypertensive chronic kidney disease with stage 1 through stage 4 chronic kidney disease, or unspecified chronic kidney disease: Secondary | ICD-10-CM | POA: Diagnosis not present

## 2019-12-07 DIAGNOSIS — E782 Mixed hyperlipidemia: Secondary | ICD-10-CM | POA: Diagnosis not present

## 2019-12-07 DIAGNOSIS — N182 Chronic kidney disease, stage 2 (mild): Secondary | ICD-10-CM | POA: Diagnosis not present

## 2019-12-07 DIAGNOSIS — Z712 Person consulting for explanation of examination or test findings: Secondary | ICD-10-CM | POA: Diagnosis not present

## 2019-12-07 DIAGNOSIS — Z23 Encounter for immunization: Secondary | ICD-10-CM | POA: Diagnosis not present

## 2019-12-07 DIAGNOSIS — R7301 Impaired fasting glucose: Secondary | ICD-10-CM | POA: Diagnosis not present

## 2019-12-07 DIAGNOSIS — E039 Hypothyroidism, unspecified: Secondary | ICD-10-CM | POA: Diagnosis not present

## 2019-12-07 DIAGNOSIS — R944 Abnormal results of kidney function studies: Secondary | ICD-10-CM | POA: Diagnosis not present

## 2019-12-07 DIAGNOSIS — K219 Gastro-esophageal reflux disease without esophagitis: Secondary | ICD-10-CM | POA: Diagnosis not present

## 2019-12-13 DIAGNOSIS — R7303 Prediabetes: Secondary | ICD-10-CM | POA: Diagnosis not present

## 2019-12-13 DIAGNOSIS — I10 Essential (primary) hypertension: Secondary | ICD-10-CM | POA: Diagnosis not present

## 2019-12-13 DIAGNOSIS — R7301 Impaired fasting glucose: Secondary | ICD-10-CM | POA: Diagnosis not present

## 2019-12-13 DIAGNOSIS — Z6828 Body mass index (BMI) 28.0-28.9, adult: Secondary | ICD-10-CM | POA: Diagnosis not present

## 2019-12-13 DIAGNOSIS — E039 Hypothyroidism, unspecified: Secondary | ICD-10-CM | POA: Diagnosis not present

## 2019-12-13 DIAGNOSIS — E782 Mixed hyperlipidemia: Secondary | ICD-10-CM | POA: Diagnosis not present

## 2019-12-13 DIAGNOSIS — R600 Localized edema: Secondary | ICD-10-CM | POA: Diagnosis not present

## 2019-12-14 DIAGNOSIS — L900 Lichen sclerosus et atrophicus: Secondary | ICD-10-CM | POA: Diagnosis not present

## 2019-12-14 DIAGNOSIS — R7303 Prediabetes: Secondary | ICD-10-CM | POA: Diagnosis not present

## 2019-12-14 DIAGNOSIS — Z6829 Body mass index (BMI) 29.0-29.9, adult: Secondary | ICD-10-CM | POA: Diagnosis not present

## 2019-12-14 DIAGNOSIS — E782 Mixed hyperlipidemia: Secondary | ICD-10-CM | POA: Diagnosis not present

## 2019-12-14 DIAGNOSIS — R7301 Impaired fasting glucose: Secondary | ICD-10-CM | POA: Diagnosis not present

## 2019-12-14 DIAGNOSIS — E039 Hypothyroidism, unspecified: Secondary | ICD-10-CM | POA: Diagnosis not present

## 2019-12-14 DIAGNOSIS — I10 Essential (primary) hypertension: Secondary | ICD-10-CM | POA: Diagnosis not present

## 2019-12-14 DIAGNOSIS — J3089 Other allergic rhinitis: Secondary | ICD-10-CM | POA: Diagnosis not present

## 2019-12-14 DIAGNOSIS — R600 Localized edema: Secondary | ICD-10-CM | POA: Diagnosis not present

## 2020-01-13 DIAGNOSIS — R7301 Impaired fasting glucose: Secondary | ICD-10-CM | POA: Diagnosis not present

## 2020-01-13 DIAGNOSIS — E039 Hypothyroidism, unspecified: Secondary | ICD-10-CM | POA: Diagnosis not present

## 2020-01-13 DIAGNOSIS — I10 Essential (primary) hypertension: Secondary | ICD-10-CM | POA: Diagnosis not present

## 2020-01-13 DIAGNOSIS — R600 Localized edema: Secondary | ICD-10-CM | POA: Diagnosis not present

## 2020-01-13 DIAGNOSIS — Z6828 Body mass index (BMI) 28.0-28.9, adult: Secondary | ICD-10-CM | POA: Diagnosis not present

## 2020-01-13 DIAGNOSIS — R7303 Prediabetes: Secondary | ICD-10-CM | POA: Diagnosis not present

## 2020-01-13 DIAGNOSIS — E782 Mixed hyperlipidemia: Secondary | ICD-10-CM | POA: Diagnosis not present

## 2020-02-09 DIAGNOSIS — R600 Localized edema: Secondary | ICD-10-CM | POA: Diagnosis not present

## 2020-02-09 DIAGNOSIS — I10 Essential (primary) hypertension: Secondary | ICD-10-CM | POA: Diagnosis not present

## 2020-02-09 DIAGNOSIS — E039 Hypothyroidism, unspecified: Secondary | ICD-10-CM | POA: Diagnosis not present

## 2020-02-09 DIAGNOSIS — E782 Mixed hyperlipidemia: Secondary | ICD-10-CM | POA: Diagnosis not present

## 2020-02-09 DIAGNOSIS — R7303 Prediabetes: Secondary | ICD-10-CM | POA: Diagnosis not present

## 2020-02-09 DIAGNOSIS — R7301 Impaired fasting glucose: Secondary | ICD-10-CM | POA: Diagnosis not present

## 2020-02-09 DIAGNOSIS — Z6828 Body mass index (BMI) 28.0-28.9, adult: Secondary | ICD-10-CM | POA: Diagnosis not present

## 2020-03-08 DIAGNOSIS — I10 Essential (primary) hypertension: Secondary | ICD-10-CM | POA: Diagnosis not present

## 2020-03-08 DIAGNOSIS — R600 Localized edema: Secondary | ICD-10-CM | POA: Diagnosis not present

## 2020-03-08 DIAGNOSIS — R7301 Impaired fasting glucose: Secondary | ICD-10-CM | POA: Diagnosis not present

## 2020-03-08 DIAGNOSIS — R7303 Prediabetes: Secondary | ICD-10-CM | POA: Diagnosis not present

## 2020-03-08 DIAGNOSIS — E039 Hypothyroidism, unspecified: Secondary | ICD-10-CM | POA: Diagnosis not present

## 2020-03-08 DIAGNOSIS — E782 Mixed hyperlipidemia: Secondary | ICD-10-CM | POA: Diagnosis not present

## 2020-03-08 DIAGNOSIS — Z6828 Body mass index (BMI) 28.0-28.9, adult: Secondary | ICD-10-CM | POA: Diagnosis not present

## 2020-04-04 DIAGNOSIS — E039 Hypothyroidism, unspecified: Secondary | ICD-10-CM | POA: Diagnosis not present

## 2020-04-04 DIAGNOSIS — R7301 Impaired fasting glucose: Secondary | ICD-10-CM | POA: Diagnosis not present

## 2020-04-04 DIAGNOSIS — R7303 Prediabetes: Secondary | ICD-10-CM | POA: Diagnosis not present

## 2020-04-04 DIAGNOSIS — R600 Localized edema: Secondary | ICD-10-CM | POA: Diagnosis not present

## 2020-04-04 DIAGNOSIS — E782 Mixed hyperlipidemia: Secondary | ICD-10-CM | POA: Diagnosis not present

## 2020-04-04 DIAGNOSIS — I10 Essential (primary) hypertension: Secondary | ICD-10-CM | POA: Diagnosis not present

## 2020-04-11 DIAGNOSIS — D509 Iron deficiency anemia, unspecified: Secondary | ICD-10-CM | POA: Diagnosis not present

## 2020-04-11 DIAGNOSIS — I10 Essential (primary) hypertension: Secondary | ICD-10-CM | POA: Diagnosis not present

## 2020-04-11 DIAGNOSIS — J449 Chronic obstructive pulmonary disease, unspecified: Secondary | ICD-10-CM | POA: Diagnosis not present

## 2020-04-11 DIAGNOSIS — L900 Lichen sclerosus et atrophicus: Secondary | ICD-10-CM | POA: Diagnosis not present

## 2020-04-11 DIAGNOSIS — J3089 Other allergic rhinitis: Secondary | ICD-10-CM | POA: Diagnosis not present

## 2020-04-11 DIAGNOSIS — K219 Gastro-esophageal reflux disease without esophagitis: Secondary | ICD-10-CM | POA: Diagnosis not present

## 2020-04-11 DIAGNOSIS — K5900 Constipation, unspecified: Secondary | ICD-10-CM | POA: Diagnosis not present

## 2020-04-11 DIAGNOSIS — E039 Hypothyroidism, unspecified: Secondary | ICD-10-CM | POA: Diagnosis not present

## 2020-04-11 DIAGNOSIS — L600 Ingrowing nail: Secondary | ICD-10-CM | POA: Diagnosis not present

## 2020-04-11 DIAGNOSIS — E782 Mixed hyperlipidemia: Secondary | ICD-10-CM | POA: Diagnosis not present

## 2020-04-11 DIAGNOSIS — E7849 Other hyperlipidemia: Secondary | ICD-10-CM | POA: Diagnosis not present

## 2020-04-11 DIAGNOSIS — I129 Hypertensive chronic kidney disease with stage 1 through stage 4 chronic kidney disease, or unspecified chronic kidney disease: Secondary | ICD-10-CM | POA: Diagnosis not present

## 2020-04-18 DIAGNOSIS — H905 Unspecified sensorineural hearing loss: Secondary | ICD-10-CM | POA: Diagnosis not present

## 2020-04-24 ENCOUNTER — Emergency Department (HOSPITAL_COMMUNITY): Payer: PPO

## 2020-04-24 ENCOUNTER — Emergency Department (HOSPITAL_COMMUNITY)
Admission: EM | Admit: 2020-04-24 | Discharge: 2020-04-24 | Disposition: A | Discharge status: Home / Self Care | Payer: PPO | Attending: Emergency Medicine | Admitting: Emergency Medicine

## 2020-04-24 ENCOUNTER — Other Ambulatory Visit: Payer: Self-pay

## 2020-04-24 ENCOUNTER — Encounter (HOSPITAL_COMMUNITY): Payer: Self-pay | Admitting: Emergency Medicine

## 2020-04-24 DIAGNOSIS — I10 Essential (primary) hypertension: Secondary | ICD-10-CM | POA: Diagnosis not present

## 2020-04-24 DIAGNOSIS — Y939 Activity, unspecified: Secondary | ICD-10-CM | POA: Diagnosis not present

## 2020-04-24 DIAGNOSIS — Z79899 Other long term (current) drug therapy: Secondary | ICD-10-CM | POA: Insufficient documentation

## 2020-04-24 DIAGNOSIS — Y999 Unspecified external cause status: Secondary | ICD-10-CM | POA: Insufficient documentation

## 2020-04-24 DIAGNOSIS — M26629 Arthralgia of temporomandibular joint, unspecified side: Secondary | ICD-10-CM | POA: Insufficient documentation

## 2020-04-24 DIAGNOSIS — E039 Hypothyroidism, unspecified: Secondary | ICD-10-CM | POA: Diagnosis not present

## 2020-04-24 DIAGNOSIS — R0902 Hypoxemia: Secondary | ICD-10-CM | POA: Diagnosis not present

## 2020-04-24 DIAGNOSIS — W108XXA Fall (on) (from) other stairs and steps, initial encounter: Secondary | ICD-10-CM | POA: Insufficient documentation

## 2020-04-24 DIAGNOSIS — S42301A Unspecified fracture of shaft of humerus, right arm, initial encounter for closed fracture: Secondary | ICD-10-CM | POA: Diagnosis not present

## 2020-04-24 DIAGNOSIS — S42291A Other displaced fracture of upper end of right humerus, initial encounter for closed fracture: Secondary | ICD-10-CM

## 2020-04-24 DIAGNOSIS — J449 Chronic obstructive pulmonary disease, unspecified: Secondary | ICD-10-CM | POA: Diagnosis not present

## 2020-04-24 DIAGNOSIS — Y92096 Garden or yard of other non-institutional residence as the place of occurrence of the external cause: Secondary | ICD-10-CM | POA: Insufficient documentation

## 2020-04-24 DIAGNOSIS — S4991XA Unspecified injury of right shoulder and upper arm, initial encounter: Secondary | ICD-10-CM | POA: Diagnosis present

## 2020-04-24 DIAGNOSIS — W19XXXA Unspecified fall, initial encounter: Secondary | ICD-10-CM | POA: Diagnosis not present

## 2020-04-24 DIAGNOSIS — S42201A Unspecified fracture of upper end of right humerus, initial encounter for closed fracture: Secondary | ICD-10-CM | POA: Diagnosis not present

## 2020-04-24 DIAGNOSIS — R52 Pain, unspecified: Secondary | ICD-10-CM | POA: Diagnosis not present

## 2020-04-24 MED ORDER — HYDROCODONE-ACETAMINOPHEN 5-325 MG PO TABS: 1.0000 | ORAL_TABLET | Freq: Four times a day (QID) | ORAL | 0 refills | Status: DC | PRN

## 2020-04-24 MED ORDER — HYDROCODONE-ACETAMINOPHEN 5-325 MG PO TABS
1.0000 | ORAL_TABLET | Freq: Once | ORAL | Status: AC
  Administered 2020-04-24: 1 via ORAL
  Filled 2020-04-24: qty 1

## 2020-04-24 NOTE — ED Provider Notes (Signed)
[facility] EMERGENCY DEPARTMENT Provider Note   CSN: [id number] Arrival date & time: 04/24/20  1857     History Chief Complaint  Patient presents with  . Arm Pain    [name] is a 86 y.o. female.  HPI   86 y/o  Female - had a fall in the yard has shoulder pain Constant, worse with movement - associated with swelling, No numbness /weakness - no tx pre hospital.  No prior fracture, no anticoagulants, no head injury.  She states that she was coming down her steps, thinks she lost her balance and fell to the ground striking her right shoulder.  Daughter is present as well as an additional historians and thinks that she might of bumped her head, there is no signs of head injury, no loss of consciousness, no nausea vomiting or seizures.  Past Medical History:  Diagnosis Date  . COPD (chronic obstructive pulmonary disease) (HCC)   . Hypercholesterolemia   . Hypertension   . Thyroid disease     Patient Active Problem List   Diagnosis Date Noted  . Lichen sclerosus et atrophicus 06/30/2019  . Pneumococcal pneumonia (HCC) 10/28/2012  . Wheezing 10/28/2012  . Hypothyroidism 10/28/2012  . HTN (hypertension), benign 10/28/2012  . Hypercholesteremia 10/28/2012  . Dehydration 10/28/2012    Past Surgical History:  Procedure Laterality Date  . ABDOMINAL HYSTERECTOMY    . DILATION AND CURETTAGE OF UTERUS    . nephrostomy tube       OB History    Gravida  2   Para  2   Term  2   Preterm      AB      Living  2     SAB      TAB      Ectopic      Multiple      Live Births  2           No family history on file.  Social History   Tobacco Use  . Smoking status: Never Smoker  . Smokeless tobacco: Never Used  Substance Use Topics  . Alcohol use: No  . Drug use: No    Home Medications Prior to Admission medications   Medication Sig Start Date End Date Taking? Authorizing Provider  amLODipine (NORVASC) 5 MG tablet Take 5 mg by mouth daily.     [provider]  cetirizine (ZYRTEC) 10 MG tablet Take 10 mg by mouth daily.    [provider]  clobetasol cream (TEMOVATE) 0.05 % Apply bid x 2 weeks then 2-3 x weekly 06/30/19   Cyril Mourning A, NP  fenofibrate micronized (LOFIBRA) 134 MG capsule Take 134 mg by mouth daily.    [provider]  HYDROcodone-acetaminophen (NORCO/VICODIN) 5-325 MG tablet Take 1 tablet by mouth every 6 (six) hours as needed for moderate pain. 04/24/20   Eber Hong, MD  levofloxacin (LEVAQUIN) 500 MG tablet Take 500 mg by mouth daily.    [provider]  levothyroxine (SYNTHROID) 75 MCG tablet Take 75 mcg by mouth every morning. 10/19/19   [provider]  metoprolol succinate (TOPROL-XL) 25 MG 24 hr tablet Take 25 mg by mouth at bedtime. 10/19/19   [provider]  montelukast (SINGULAIR) 10 MG tablet Take 10 mg by mouth daily.    [provider]  rosuvastatin (CRESTOR) 20 MG tablet Take 20 mg by mouth every evening.    [provider]  valsartan-hydrochlorothiazide (DIOVAN-HCT) 320-12.5 MG per tablet Take 1  tablet by mouth daily.    [provider]    Allergies    Patient has no known allergies.  Review of Systems   Review of Systems  Musculoskeletal: Positive for arthralgias. Negative for back pain and neck pain.  Skin: Negative for wound.  Neurological: Negative for weakness, numbness and headaches.    Physical Exam Updated Vital Signs BP (!) 189/53 (BP Location: Left Arm)   Pulse 76   Temp 98.4 F (36.9 C) (Oral)   Resp 18   Ht 1.651 m (5\' 5" )   Wt 74.8 kg   SpO2 95%   BMI 27.46 kg/m   Physical Exam Vitals and nursing note reviewed.  Constitutional:      Appearance: She is well-developed. She is not diaphoretic.  HENT:     Head: Normocephalic and atraumatic.  Eyes:     General:        Right eye: No discharge.        Left eye: No discharge.     Conjunctiva/sclera: Conjunctivae normal.  Pulmonary:      Effort: Pulmonary effort is normal. No respiratory distress.  Musculoskeletal:        General: Swelling, tenderness and deformity present.     Comments: Right shoulder with tenderness and swelling around the shoulder girdle.  Decreased range of motion of the shoulder to both internal and external rotation as well as flexion and extension.  Abduction and abduction also causing pain.  Normal pulses at the right wrist  Skin:    General: Skin is warm and dry.     Findings: No erythema or rash.  Neurological:     Mental Status: She is alert.     Coordination: Coordination normal.     Comments: No numbness or weakness of the right upper extremity     ED Results / Procedures / Treatments   Labs (all labs ordered are listed, but only abnormal results are displayed) Labs Reviewed - No data to display  EKG None  Radiology DG Shoulder Right  Result Date: 04/24/2020 CLINICAL DATA:  04/26/2020 in yard EXAM: RIGHT SHOULDER - 2+ VIEW COMPARISON:  None. FINDINGS: Acute mildly impacted right humeral neck fracture. Possible slight anterior positioning of the humeral head with respect to the glenoid fossa on Y-view. AC joint appears intact. IMPRESSION: Acute mildly impacted right humeral neck fracture. Possible slight anterior positioning of the humeral head with respect to the glenoid fossa. Electronically Signed   By: Larey Seat M.D.   On: 04/24/2020 19:56    Procedures Procedures (including critical care time)  Medications Ordered in ED Medications  HYDROcodone-acetaminophen (NORCO/VICODIN) 5-325 MG per tablet 1 tablet (1 tablet Oral Given 04/24/20 2112)    ED Course  I have reviewed the triage vital signs and the nursing notes.  Pertinent labs & imaging results that were available during my care of the patient were reviewed by me and considered in my medical decision making (see chart for details).    MDM Rules/Calculators/A&P                          Examination of this very kind patient  shows that she has a acute mildly impacted right humeral neck fracture, do not see any signs of obvious dislocation, she has intact pulses and neurovascular exam is normal.  No other fractures evident clinically below the shoulder.  Patient agreeable to discharge, splint in the form of a sling placed by nursing, I reevaluated  the patient after this and found her to have good pulses.  1 tablet of hydrocodone given, discharge instructions, she has an already established relationship with Dr. Romeo Apple according to the daughter who will stay with her tonight.  Final Clinical Impression(s) / ED Diagnoses Final diagnoses:  Humerus head fracture, right, closed, initial encounter    Rx / DC Orders ED Discharge Orders         Ordered    HYDROcodone-acetaminophen (NORCO/VICODIN) 5-325 MG tablet  Every 6 hours PRN     Discontinue  Reprint     04/24/20 2114           Eber Hong, MD 04/24/20 2117

## 2020-04-24 NOTE — ED Notes (Signed)
Pt verbalized understanding of discharge paperwork and instructions. Signature pad not working.

## 2020-04-24 NOTE — Discharge Instructions (Signed)
Your testing today shows that you have a slight broken bone at the top of your shoulder.  This should heal by itself with just using a sling however I do want you to follow-up with Dr. Romeo Apple, the orthopedic surgeon in town.  You may take hydrocodone 1 tablet every 6 hours as needed for pain only of Tylenol or ibuprofen is not helping.  This may make you sleepy, constipated or feel nauseated.  Please take a stool softener if taking this medication.  Emergency department for increasing or worsening or severe swelling pain numbness weakness or any other severe or worsening symptoms

## 2020-04-24 NOTE — ED Triage Notes (Signed)
Pt reports tripping and falling over stepping stones in her yard. Pt reports right upper arm pain. Pt unsure if she hit her head.

## 2020-04-28 ENCOUNTER — Telehealth: Payer: Self-pay | Admitting: Orthopedic Surgery

## 2020-04-28 NOTE — Telephone Encounter (Signed)
Scheduled patient as requested, for [facility] Emergency room appointment for problem, right shoulder fracture "humerus head fracture, right" per chart. Forwarding to supervisor, Toniann Fail / [phone]

## 2020-04-28 NOTE — Telephone Encounter (Signed)
Initial call received this morning from patient/daughter [name] regarding emergency room visit at [facility], Monday, 04/24/20; states was advised by emergency room provider to call by today for appointment for next week. Offered for this morning - states would prefer next week, as emergency room doctor relayed.

## 2020-05-01 ENCOUNTER — Ambulatory Visit: Payer: PPO | Admitting: Orthopedic Surgery

## 2020-05-01 ENCOUNTER — Other Ambulatory Visit: Payer: Self-pay

## 2020-05-01 ENCOUNTER — Encounter: Payer: Self-pay | Admitting: Orthopedic Surgery

## 2020-05-01 VITALS — BP 132/64 | HR 71 | Ht 65.0 in | Wt 166.5 lb

## 2020-05-01 DIAGNOSIS — S42294A Other nondisplaced fracture of upper end of right humerus, initial encounter for closed fracture: Secondary | ICD-10-CM

## 2020-05-01 NOTE — Progress Notes (Signed)
Patient ID: [name], female   DOB: 30-Aug-1931, 86 y.o.   MRN: [id number]  Chief Complaint  Patient presents with  . Shoulder Pain    R/DOI 04/24/20/ fell in yard     HPI [name] is a 86 y.o. female.  With a right proximal humerus fracture  86 yo fell 1 week ago  inj rt shoulder C/o mild pain prox hum   Review of Systems Review of Systems  Constitutional: Negative for activity change.  Neurological: Negative for weakness and numbness.    Past Medical History:  Diagnosis Date  . COPD (chronic obstructive pulmonary disease) (HCC)   . Hypercholesterolemia   . Hypertension   . Thyroid disease     Past Surgical History:  Procedure Laterality Date  . ABDOMINAL HYSTERECTOMY    . DILATION AND CURETTAGE OF UTERUS    . nephrostomy tube      No family history on file.  Social History Social History   Tobacco Use  . Smoking status: Never Smoker  . Smokeless tobacco: Never Used  Substance Use Topics  . Alcohol use: No  . Drug use: No    No Known Allergies  Current Outpatient Medications  Medication Sig Dispense Refill  . fenofibrate micronized (LOFIBRA) 134 MG capsule Take 134 mg by mouth daily.    Marland Kitchen levothyroxine (SYNTHROID) 75 MCG tablet Take 75 mcg by mouth every morning.    Marland Kitchen lisinopril (ZESTRIL) 40 MG tablet Take 40 mg by mouth daily.    . montelukast (SINGULAIR) 10 MG tablet Take 10 mg by mouth daily.    . propranolol (INDERAL) 10 MG tablet Take 10 mg by mouth 2 (two) times daily.    Marland Kitchen amLODipine (NORVASC) 5 MG tablet Take 5 mg by mouth daily. (Patient not taking: Reported on 05/01/2020)    . cetirizine (ZYRTEC) 10 MG tablet Take 10 mg by mouth daily. (Patient not taking: Reported on 05/01/2020)    . clobetasol cream (TEMOVATE) 0.05 % Apply bid x 2 weeks then 2-3 x weekly (Patient not taking: Reported on 05/01/2020) 45 g 3  . HYDROcodone-acetaminophen (NORCO/VICODIN) 5-325 MG tablet Take 1 tablet by mouth every 6 (six) hours as needed for moderate  pain. (Patient not taking: Reported on 05/01/2020) 6 tablet 0  . levofloxacin (LEVAQUIN) 500 MG tablet Take 500 mg by mouth daily. (Patient not taking: Reported on 05/01/2020)    . metoprolol succinate (TOPROL-XL) 25 MG 24 hr tablet Take 25 mg by mouth at bedtime. (Patient not taking: Reported on 05/01/2020)    . rosuvastatin (CRESTOR) 20 MG tablet Take 20 mg by mouth every evening. (Patient not taking: Reported on 05/01/2020)    . valsartan-hydrochlorothiazide (DIOVAN-HCT) 320-12.5 MG per tablet Take 1 tablet by mouth daily. (Patient not taking: Reported on 05/01/2020)     No current facility-administered medications for this visit.    Physical Exam Blood pressure 132/64, pulse 71, height 5\' 5"  (1.651 m), weight 166 lb 8 oz (75.5 kg). Physical Exam The patient is well developed well nourished and well groomed.  Orientation to person place and time is normal  Mood is pleasant.  Ambulatory status walks   Right  shoulder  Examination: Inspection of the shoulder shows that there is ecchymosis of the skin proximal humerus down into the upper arm. Tenderness at the proximal humerus and fracture site is noted.  Range of motion examination is deferred because of pain.  Motor exam hand wrist elbow normal including normal muscle tone.  Shoulder stability  tests deferred because of fracture, elbow stable wrist stable.  Neurovascular examination is intact and the lymph nodes in the axilla and supraclavicular regions are normal   The opposite shoulder has no swelling, normal range of motion, no joint contracture subluxation atrophy tremor or skin lesion. Neurovascular exam is intact.   MEDICAL DECISION MAKING  A.  Encounter Diagnosis  Name Primary?  . Other closed nondisplaced fracture of proximal end of right humerus, initial encounter Yes    B. DATA ANALYSED:  IMAGING: Independent interpretation of images: This is a nondisplaced proximal humerus fracture and an antiten  Orders:   Outside  records reviewed:   C. MANAGEMENT   Aswad 2 weeks repeat x-ray then start physical therapy  No orders of the defined types were placed in this encounter.

## 2020-05-04 DIAGNOSIS — H905 Unspecified sensorineural hearing loss: Secondary | ICD-10-CM | POA: Diagnosis not present

## 2020-05-11 DIAGNOSIS — E782 Mixed hyperlipidemia: Secondary | ICD-10-CM | POA: Diagnosis not present

## 2020-05-11 DIAGNOSIS — R7301 Impaired fasting glucose: Secondary | ICD-10-CM | POA: Diagnosis not present

## 2020-05-11 DIAGNOSIS — R600 Localized edema: Secondary | ICD-10-CM | POA: Diagnosis not present

## 2020-05-11 DIAGNOSIS — R7303 Prediabetes: Secondary | ICD-10-CM | POA: Diagnosis not present

## 2020-05-11 DIAGNOSIS — I10 Essential (primary) hypertension: Secondary | ICD-10-CM | POA: Diagnosis not present

## 2020-05-11 DIAGNOSIS — E039 Hypothyroidism, unspecified: Secondary | ICD-10-CM | POA: Diagnosis not present

## 2020-05-16 ENCOUNTER — Other Ambulatory Visit: Payer: Self-pay

## 2020-05-16 ENCOUNTER — Encounter: Payer: Self-pay | Admitting: Orthopedic Surgery

## 2020-05-16 ENCOUNTER — Ambulatory Visit (INDEPENDENT_AMBULATORY_CARE_PROVIDER_SITE_OTHER): Payer: PPO | Admitting: Orthopedic Surgery

## 2020-05-16 ENCOUNTER — Ambulatory Visit: Payer: PPO

## 2020-05-16 DIAGNOSIS — S42294D Other nondisplaced fracture of upper end of right humerus, subsequent encounter for fracture with routine healing: Secondary | ICD-10-CM

## 2020-05-16 NOTE — Progress Notes (Signed)
Chief Complaint  Patient presents with  . Shoulder Injury    right shoulder / wants to d/c sling 04/24/20    86 year old female with right shoulder pain from a fracture initial x-ray showed fracture completely aligned now fracture shows 50 to 60% displacement elbow patient wants to have the sling removed  Pain has improved significantly she is actually been Using the arm a little bit so she says.  She cannot start therapy till the 30th because she does not have a ride and does not want therapy to come to the house  X-rays explained  Recent meta-analysis results discussed but pertaining to surgery versus no surgery in this age group and presented to them that the results are not very different  They will continue with therapy starting on the 30th  Follow-up 8 weeks   Past Medical History:  Diagnosis Date  . COPD (chronic obstructive pulmonary disease) (HCC)   . Hypercholesterolemia   . Hypertension   . Thyroid disease    Past Surgical History:  Procedure Laterality Date  . ABDOMINAL HYSTERECTOMY    . DILATION AND CURETTAGE OF UTERUS    . nephrostomy tube

## 2020-05-29 ENCOUNTER — Ambulatory Visit (HOSPITAL_COMMUNITY): Payer: PPO | Attending: Orthopedic Surgery

## 2020-05-29 ENCOUNTER — Other Ambulatory Visit: Payer: Self-pay

## 2020-05-29 ENCOUNTER — Encounter (HOSPITAL_COMMUNITY): Payer: Self-pay

## 2020-05-29 DIAGNOSIS — M25511 Pain in right shoulder: Secondary | ICD-10-CM

## 2020-05-29 DIAGNOSIS — R29898 Other symptoms and signs involving the musculoskeletal system: Secondary | ICD-10-CM | POA: Insufficient documentation

## 2020-05-29 DIAGNOSIS — M25611 Stiffness of right shoulder, not elsewhere classified: Secondary | ICD-10-CM | POA: Diagnosis not present

## 2020-05-29 NOTE — Patient Instructions (Signed)
When seated at home, elevate right arm above heart and use ice for swelling.  Heat is ok close to neck to decrease muscle tightness.     Complete 1-2 times a day.   1) SHOULDER: Flexion On Table   Place hands on towel placed on table, elbows straight. Lean forward with you upper body, pushing towel away from body.  _10-15__ reps per set, ___ sets per day  2) Abduction (Passive)   With arm out to side, resting on towel placed on table with palm DOWN, keeping trunk away from table, lean to the side while pushing towel away from body.  Repeat _10-15___ times. Do ____ sessions per day.  Copyright  [employer]. All rights reserved.     3) Internal Rotation (Assistive)   Seated with elbow bent at right angle and held against side, slide arm on table surface in an inward arc keeping elbow anchored in place. Repeat _10-15___ times. Do ____ sessions per day. Activity: Use this motion to brush crumbs off the table.  Copyright  [employer]. All rights reserved.    ELBOW FLEXION EXTENSION  Start with your arm at your side. Bend at your elbow to raise your forearm/hand upwards as shown. Then return to starting position and repeat.  Complete 10-15 repetitions.

## 2020-05-30 NOTE — Therapy (Signed)
[facility] [address] [place], [place], [address] Phone: [phone]   Fax:  [phone]  Occupational Therapy Evaluation  Patient Details  Name: [name] MRN: [id number] Date of Birth: [birth year] Referring Provider (OT): Lauren CanadaStanley Harrison, MD   Encounter Date: 05/29/2020   OT End of Session - 05/30/20 0949    Visit Number 1    Number of Visits 24    Date for OT Re-Evaluation 08/21/20   min reassess:06/26/20   Authorization Type [employer] -$15 copay    Progress Note Due on Visit 10    OT Start Time 1730    OT Stop Time 1820    OT Time Calculation (min) 50 min    Activity Tolerance Patient tolerated treatment well;Patient limited by pain    Behavior During Therapy Anxious;WFL for tasks assessed/performed   anxious with movement due to pain          Past Medical History:  Diagnosis Date  . COPD (chronic obstructive pulmonary disease) (HCC)   . Hypercholesterolemia   . Hypertension   . Thyroid disease     Past Surgical History:  Procedure Laterality Date  . ABDOMINAL HYSTERECTOMY    . DILATION AND CURETTAGE OF UTERUS    . nephrostomy tube      There were no vitals filed for this visit.   Subjective Assessment - 05/29/20 1738    Subjective  S: When can I drive again?    Patient is accompanied by: Family member   daughter: [name]   Pertinent History Patient is a 86 y/o female S/P right proximal humerus fracture which was sutained from a mechanical fall on 04/24/20. Pt was initially placed in a sling which was removed at last MD appointment. Initial X-ray presented with normal alignment although at recent follow up appointment 50-60% displacement was noted. Dr. Romeo AppleHarrison has referred patient to occupational therapy for evaluation and treatment.    Patient Stated Goals To be able to drive and use her right arm as normal.    Currently in Pain? No/denies   6/10 with movement            [facility]  - 05/29/20 1742      Assessment   Medical Diagnosis right proximal humerus fracture    Referring Provider (OT) Lauren CanadaStanley Harrison, MD    Onset Date/Surgical Date 04/24/20    Hand Dominance Right    Next MD Visit 07/24/20    Prior Therapy None for this referral       Precautions   Precautions Shoulder    Type of Shoulder Precautions Week 5-8 (8/30-9/20): P/ROM/AA/ROM to 90 degrees forward flexion, progress as tolerated, er per tolerance, IR in scapular plane, pullys, no IR behind back or posterior capsule stretch.  Week 9-12 (9/27-10/18) A/ROM and progress to RTC and scapular strengthening by week 12.      Restrictions   Weight Bearing Restrictions Yes    RUE Weight Bearing Non weight bearing      Balance Screen   Has the patient fallen in the past 6 months Yes   Possible PT referral needed half way through    How many times? 1    Has the patient had a decrease in activity level because of a fear of falling?  Yes    Is the patient reluctant to leave their home because of a fear of falling?  Yes      Home  Environment   Family/patient expects to be  discharged to: Private residence    Living Arrangements Alone    Additional Comments home is in the backyard from daughter      Prior Function   Level of Independence Independent    Vocation Retired      ADL   ADL comments Difficulty complete dressing tasks such as putting shirts on/off, pulling pants down/pulling up, writing, simple meal prep, combing hair, brushing teeth.       Mobility   Mobility Status History of falls      Written Expression   Dominant Hand --      Vision - History   Baseline Vision No visual deficits      Cognition   Overall Cognitive Status Within Functional Limits for tasks assessed    Cognition Comments Patient is HOH.       Observation/Other Assessments   Observations Visual bruising noted from fall still present.    Focus on Therapeutic Outcomes (FOTO)  complete at next session       Posture/Postural Control   Posture/Postural Control Postural limitations    Postural Limitations Rounded Shoulders;Forward head      Edema   Edema Visual edema noted in right UE ( proximal to elbow extending up in to the shoulder.)      ROM / Strength   AROM / PROM / Strength AROM;PROM;Strength      Palpation   Palpation comment Max fascial restrictions noted in right upper arm, trapezius, and scapularis region.       AROM   Overall AROM  Unable to assess;Due to precautions      PROM   Overall PROM Comments Assessed supine. IR/er adducted    PROM Assessment Site Shoulder    Right/Left Shoulder Right    Right Shoulder Flexion 95 Degrees    Right Shoulder ABduction 92 Degrees    Right Shoulder Internal Rotation 88 Degrees    Right Shoulder External Rotation 38 Degrees      Strength   Overall Strength Unable to assess;Due to precautions                           OT Education - 05/30/20 0945    Education Details Reviewed findings of evaluation and patient goals. Provided table slides and A/ROM elbow flexion/extension.    Person(s) Educated Patient;Child(ren)    Methods Explanation;Demonstration;Tactile cues;Verbal cues;Handout    Comprehension Verbalized understanding;Returned demonstration;Verbal cues required;Tactile cues required;Need further instruction            OT Short Term Goals - 05/30/20 1138      OT SHORT TERM GOAL #1   Title Patient will be educated and independent (with family assisting if needed) with HEP in order to increase the functional use of her RUE during simple waist level self care tasks.    Time 6    Period Weeks    Status New    Target Date 07/10/20      OT SHORT TERM GOAL #2   Title Patient will increase her RUE P/ROM to [facility] in order to complete dressing tasks such as pulling a shirt on or off with less difficulty.    Time 6    Period Weeks    Status New      OT SHORT TERM GOAL #3   Title Patient will increase her RUE  strength to 3/5 in order to complete reaching activities below her shoulder level with less difficulty.    Time 6  Period Weeks    Status New      OT SHORT TERM GOAL #4   Title Patient will report a decrease in pain level of 4/10 when completing simple meal prep tasks using her RUE.    Time 6    Period Weeks    Status New      OT SHORT TERM GOAL #5   Title Patient will decrease fascial restrictions in her RUE to Moderate amount in order to increase the functional mobility needed to complete grooming tasks.    Time 6    Period Weeks    Status New             OT Long Term Goals - 05/30/20 1147      OT LONG TERM GOAL #1   Title Patient will return to using her RUE as her dominant extremity for 75% or more of daily tasks.    Time 12    Period Weeks    Status New    Target Date 08/21/20      OT LONG TERM GOAL #2   Title Patient will increase her RUE A/ROM to [facility] in order to reach into low level cabinets or in the fridge for food items.    Time 12    Period Weeks    Status New      OT LONG TERM GOAL #3   Title Patient will increase RUE shoulder strength to 4-/5 in order to complete light housekeeping tasks and prepare to return to driving.    Time 12    Period Weeks    Status New      OT LONG TERM GOAL #4   Title Patient will report a decrease in pain level of approximately 2/10 or less when complete basic daily tasks using her RUE.    Time 12    Period Weeks    Status New      OT LONG TERM GOAL #5   Title Patient will decrease fascial restrictions to min amount or less in order to increase functional mobility needed to complete lower dressing tasks.    Time 12    Period Weeks    Status New                 Plan - 05/30/20 0955    Clinical Impression Statement A: Patient is a 86 y/o female S/P right proximal humerus fracture causing increased pain, fascial restrictions and decreased ROM and strength resulting in difficulty utilizing her right UE as her  dominant for ADL tasks as well as driving.    OT Occupational Profile and History Detailed Assessment- Review of Records and additional review of physical, cognitive, psychosocial history related to current functional performance    Occupational performance deficits (Please refer to evaluation for details): ADL's;IADL's;Rest and Sleep;Leisure;Social Participation    Body Structure / Function / Physical Skills ADL;UE functional use;Fascial restriction;Pain;ROM;Decreased knowledge of precautions;IADL;Strength;Edema    Rehab Potential Good    Clinical Decision Making Several treatment options, min-mod task modification necessary    Comorbidities Affecting Occupational Performance: Presence of comorbidities impacting occupational performance    Comorbidities impacting occupational performance description: Patient is HOH and requires additional hands on cueing as well as verbal and visual cues for exercises. Pt is fearful of falling.    Modification or Assistance to Complete Evaluation  Min-Moderate modification of tasks or assist with assess necessary to complete eval    OT Frequency 2x / week    OT Duration 12 weeks  OT Treatment/Interventions Self-care/ADL training;Ultrasound;Patient/family education;DME and/or AE instruction;Passive range of motion;Cryotherapy;Electrical Stimulation;Moist Heat;Therapeutic exercise;Manual Therapy;Therapeutic activities;Neuromuscular education    Plan P: Patient will benefit from skilled OT services to increase functional performance during ADL tasks while using her RUE as her dominant extremity. Treatment Plan: myofascial release, P/ROM, AA/ROM, A/ROM, general shoulder and scapular strengthening. Education throughout as needed. Modalities PRN. Next Session: COMPLETE FOTO.    OT Home Exercise Plan eval: table slide, elbow A/ROM    Consulted and Agree with Plan of Care Patient;Family member/caregiver    Family Member Consulted daughter: [name]           Patient  will benefit from skilled therapeutic intervention in order to improve the following deficits and impairments:   Body Structure / Function / Physical Skills: ADL, UE functional use, Fascial restriction, Pain, ROM, Decreased knowledge of precautions, IADL, Strength, Edema       Visit Diagnosis: Acute pain of right shoulder - Plan: Ot plan of care cert/re-cert  Stiffness of right shoulder, not elsewhere classified - Plan: Ot plan of care cert/re-cert  Other symptoms and signs involving the musculoskeletal system - Plan: Ot plan of care cert/re-cert    Problem List Patient Active Problem List   Diagnosis Date Noted  . Lichen sclerosus et atrophicus 06/30/2019  . Pneumococcal pneumonia (HCC) 10/28/2012  . Wheezing 10/28/2012  . Hypothyroidism 10/28/2012  . HTN (hypertension), benign 10/28/2012  . Hypercholesteremia 10/28/2012  . Dehydration 10/28/2012   [name], OTR/L,CBIS  [phone]  05/30/2020, 11:59 AM  [place] [facility] [address] [place], [place], [address] Phone: [phone]   Fax:  [phone]  Name: [name] MRN: [id number] Date of Birth: 07-26-1931

## 2020-05-31 ENCOUNTER — Ambulatory Visit (HOSPITAL_COMMUNITY): Payer: PPO | Attending: Orthopedic Surgery

## 2020-05-31 ENCOUNTER — Encounter (HOSPITAL_COMMUNITY): Payer: Self-pay

## 2020-05-31 ENCOUNTER — Other Ambulatory Visit: Payer: Self-pay

## 2020-05-31 DIAGNOSIS — M25511 Pain in right shoulder: Secondary | ICD-10-CM | POA: Diagnosis not present

## 2020-05-31 DIAGNOSIS — M25611 Stiffness of right shoulder, not elsewhere classified: Secondary | ICD-10-CM | POA: Insufficient documentation

## 2020-05-31 DIAGNOSIS — R29898 Other symptoms and signs involving the musculoskeletal system: Secondary | ICD-10-CM | POA: Insufficient documentation

## 2020-05-31 NOTE — Therapy (Addendum)
[place] [facility] [address] [place], [place], [address] Phone: [phone]   Fax:  [phone]  Occupational Therapy Treatment  Patient Details  Name: [name] MRN: [id number] Date of Birth: Jul 12, 1931 Referring Provider (OT): Fuller Canada, MD   Encounter Date: 05/31/2020   OT End of Session - 05/31/20 1725    Visit Number 2    Number of Visits 24    Date for OT Re-Evaluation 08/21/20   min reassess:06/26/20   Authorization Type [employer] -$15 copay    Progress Note Due on Visit 10    OT Start Time 1648   pt using bathroom   OT Stop Time 1724    OT Time Calculation (min) 36 min    Activity Tolerance Patient tolerated treatment well;Patient limited by pain    Behavior During Therapy Anxious;WFL for tasks assessed/performed   anxious with movement due to pain          Past Medical History:  Diagnosis Date   COPD (chronic obstructive pulmonary disease) (HCC)    Hypercholesterolemia    Hypertension    Thyroid disease     Past Surgical History:  Procedure Laterality Date   ABDOMINAL HYSTERECTOMY     DILATION AND CURETTAGE OF UTERUS     nephrostomy tube      There were no vitals filed for this visit.   Subjective Assessment - 05/31/20 1657    Subjective  S: I did my stretches you told me to do    Currently in Pain? Yes    Pain Score 6     Pain Location Shoulder    Pain Orientation Right    Pain Descriptors / Indicators Sore;Aching    Pain Type Acute pain    Pain Radiating Towards N/A    Pain Onset More than a month ago    Pain Frequency Occasional    Aggravating Factors  Certain movements    Pain Relieving Factors Positioning and taking pain medication    Effect of Pain on Daily Activities Limits participation in functional activities, with certain movements    Multiple Pain Sites No              [facility] OT Assessment - 05/31/20 1704      Assessment   Medical Diagnosis right proximal  humerus fracture      Precautions   Precautions Shoulder    Type of Shoulder Precautions Week 5-8 (8/30-9/20): P/ROM/AA/ROM to 90 degrees forward flexion, progress as tolerated, er per tolerance, IR in scapular plane, pullys, no IR behind back or posterior capsule stretch.  Week 9-12 (9/27-10/18) A/ROM and progress to RTC and scapular strengthening by week 12.      Restrictions   Weight Bearing Restrictions Yes    RUE Weight Bearing Non weight bearing                    OT Treatments/Exercises (OP) - 05/31/20 1704      Exercises   Exercises Shoulder      Shoulder Exercises: Supine   Protraction PROM;5 reps    Horizontal ABduction PROM;5 reps    External Rotation PROM;5 reps    Internal Rotation PROM;5 reps    Flexion PROM;5 reps    ABduction PROM;5 reps      Shoulder Exercises: Seated   Extension 10 reps;AROM    Row AROM;10 reps      Shoulder Exercises: Therapy Ball   Flexion Both;10 reps    ABduction Right;10  reps      Shoulder Exercises: ROM/Strengthening   Thumb Tacks Seated, 10X                  OT Education - 05/31/20 1715    Education Details Reviewed pt goals and dressing technique of donning weaker arm first then strong arm. Remove right arm out of shirts last.    Person(s) Educated Patient    Methods Explanation;Demonstration;Tactile cues;Verbal cues    Comprehension Verbalized understanding;Returned demonstration;Verbal cues required;Tactile cues required            OT Short Term Goals - 05/31/20 1651      OT SHORT TERM GOAL #1   Title Patient will be educated and independent (with family assisting if needed) with HEP in order to increase the functional use of her RUE during simple waist level self care tasks.    Time 6    Period Weeks    Status On-going    Target Date 07/10/20      OT SHORT TERM GOAL #2   Title Patient will increase her RUE P/ROM to [facility] in order to complete dressing tasks such as pulling a shirt on or off with  less difficulty.    Time 6    Period Weeks    Status On-going      OT SHORT TERM GOAL #3   Title Patient will increase her RUE strength to 3/5 in order to complete reaching activities below her shoulder level with less difficulty.    Time 6    Period Weeks    Status On-going      OT SHORT TERM GOAL #4   Title Patient will report a decrease in pain level of 4/10 when completing simple meal prep tasks using her RUE.    Time 6    Period Weeks    Status On-going      OT SHORT TERM GOAL #5   Title Patient will decrease fascial restrictions in her RUE to Moderate amount in order to increase the functional mobility needed to complete grooming tasks.    Time 6    Period Weeks    Status On-going             OT Long Term Goals - 05/31/20 1829      OT LONG TERM GOAL #1   Title Patient will return to using her RUE as her dominant extremity for 75% or more of daily tasks.    Time 12    Period Weeks    Status On-going      OT LONG TERM GOAL #2   Title Patient will increase her RUE A/ROM to [facility] in order to reach into low level cabinets or in the fridge for food items.    Time 12    Period Weeks    Status On-going      OT LONG TERM GOAL #3   Title Patient will increase RUE shoulder strength to 4-/5 in order to complete light housekeeping tasks and prepare to return to driving.    Time 12    Period Weeks    Status On-going      OT LONG TERM GOAL #4   Title Patient will report a decrease in pain level of approximately 2/10 or less when complete basic daily tasks using her RUE.    Time 12    Period Weeks    Status On-going      OT LONG TERM GOAL #5   Title Patient will decrease  fascial restrictions to min amount or less in order to increase functional mobility needed to complete lower dressing tasks.    Time 12    Period Weeks    Status On-going                 Plan - 05/31/20 1726    Clinical Impression Statement A: Completed P/ROM stretches supine, with cues  required to encourage pt to relax versus muscle guarding. Pt completed scapular strengthening exercises with modifications at seated level, with verbal, tactile and demonstrations required for positoining and form. Pt required HOH technique for therapy ball abduction exercise and needed modification for thumb tacks at seated level with arm by side versus parallel to floor at wall.    Body Structure / Function / Physical Skills ADL;UE functional use;Fascial restriction;Pain;ROM;Decreased knowledge of precautions;IADL;Strength;Edema    OT Treatment/Interventions Self-care/ADL training;Ultrasound;Patient/family education;DME and/or AE instruction;Passive range of motion;Cryotherapy;Electrical Stimulation;Moist Heat;Therapeutic exercise;Manual Therapy;Therapeutic activities;Neuromuscular education    Plan P: Work on increasing ROM by decreasing pt fear with movement to increase P/ROM tolerance    Consulted and Agree with Plan of Care Patient           Patient will benefit from skilled therapeutic intervention in order to improve the following deficits and impairments:   Body Structure / Function / Physical Skills: ADL, UE functional use, Fascial restriction, Pain, ROM, Decreased knowledge of precautions, IADL, Strength, Edema       Visit Diagnosis: Acute pain of right shoulder  Stiffness of right shoulder, not elsewhere classified  Other symptoms and signs involving the musculoskeletal system    Problem List Patient Active Problem List   Diagnosis Date Noted   Lichen sclerosus et atrophicus 06/30/2019   Pneumococcal pneumonia (HCC) 10/28/2012   Wheezing 10/28/2012   Hypothyroidism 10/28/2012   HTN (hypertension), benign 10/28/2012   Hypercholesteremia 10/28/2012   Dehydration 10/28/2012    [place], OT Student [phone]  05/31/2020, 6:32 PM  [place] [facility] [address] [place], [place], [address] Phone: [phone]   Fax:   [phone]  Name: [name] MRN: [id number] Date of Birth: [birth year]

## 2020-06-07 ENCOUNTER — Encounter (HOSPITAL_COMMUNITY): Payer: Self-pay

## 2020-06-07 ENCOUNTER — Ambulatory Visit (HOSPITAL_COMMUNITY): Payer: PPO

## 2020-06-07 ENCOUNTER — Other Ambulatory Visit: Payer: Self-pay

## 2020-06-07 DIAGNOSIS — M25511 Pain in right shoulder: Secondary | ICD-10-CM

## 2020-06-07 DIAGNOSIS — R29898 Other symptoms and signs involving the musculoskeletal system: Secondary | ICD-10-CM

## 2020-06-07 DIAGNOSIS — M25611 Stiffness of right shoulder, not elsewhere classified: Secondary | ICD-10-CM

## 2020-06-07 NOTE — Patient Instructions (Signed)
Repeat all exercises 10-15 times, 1-2 times per day.  1) Shoulder Protraction    Begin with elbows by your side, slowly "punch" straight out in front of you.      2) Shoulder Flexion  Standing:         Begin with arms at your side with thumbs pointed up, slowly raise both arms up and forward towards overhead.               3) Horizontal abduction/adduction   Standing:           Begin with arms straight out in front of you, bring out to the side in at "T" shape. Keep arms straight entire time.       4) Internal & External Rotation  Standing:     Stand with elbows at the side and elbows bent 90 degrees. Move your forearms away from your body, then bring back inward toward the body.     5) Shoulder Abduction  Standing:       Begin with your arms next to your side. Slowly move your arms out to the side so that they go overhead, in a jumping jack or snow angel movement.      

## 2020-06-07 NOTE — Therapy (Signed)
[facility] [address] [place], [place], [address] Phone: [phone]   Fax:  [phone]  Occupational Therapy Treatment  Patient Details  Name: [name] MRN: [id number] Date of Birth: 22-Aug-1931 Referring Provider (OT): Fuller Canada, MD   Encounter Date: 06/07/2020   OT End of Session - 06/07/20 1401    Visit Number 3    Number of Visits 24    Date for OT Re-Evaluation 08/21/20   min reassess:06/26/20   Authorization Type [employer] -$15 copay    Progress Note Due on Visit 10    OT Start Time 0817   Pt arrived late. Requested to use the bathroom at end of session cutting it short due to time constraint.   OT Stop Time 0845    OT Time Calculation (min) 28 min    Activity Tolerance Patient tolerated treatment well;Patient limited by pain    Behavior During Therapy [facility] for tasks assessed/performed   anxious with movement due to pain          Past Medical History:  Diagnosis Date  . COPD (chronic obstructive pulmonary disease) (HCC)   . Hypercholesterolemia   . Hypertension   . Thyroid disease     Past Surgical History:  Procedure Laterality Date  . ABDOMINAL HYSTERECTOMY    . DILATION AND CURETTAGE OF UTERUS    . nephrostomy tube      There were no vitals filed for this visit.   Subjective Assessment - 06/07/20 0823    Subjective  S: I try not to take medicine even if I have pain, but my arm gets pretty sore without it    Currently in Pain? Yes    Pain Score 4     Pain Location Shoulder    Pain Orientation Right    Pain Descriptors / Indicators Sore;Aching    Pain Type Acute pain              [facility] OT Assessment - 06/07/20 0826      Assessment   Medical Diagnosis right proximal humerus fracture      Precautions   Precautions Shoulder    Type of Shoulder Precautions Week 5-8 (8/30-9/20): P/ROM/AA/ROM to 90 degrees forward flexion, progress as tolerated, er per tolerance, IR in  scapular plane, pullys, no IR behind back or posterior capsule stretch.  Week 9-12 (9/27-10/18) A/ROM and progress to RTC and scapular strengthening by week 12.      Restrictions   Weight Bearing Restrictions Yes    RUE Weight Bearing Non weight bearing                    OT Treatments/Exercises (OP) - 06/07/20 0827      Exercises   Exercises Shoulder      Shoulder Exercises: Supine   Protraction PROM;AAROM;5 reps   Stabilized at shoulder, hands on assist for AA/ROM   Horizontal ABduction PROM;5 reps    External Rotation PROM;5 reps    Internal Rotation PROM;5 reps    Flexion PROM;AAROM;5 reps   Stabilized at shoulder, hands on assist for AA/ROM   ABduction PROM;5 reps      Shoulder Exercises: Seated   Retraction AROM;10 reps    Row AROM;10 reps      Shoulder Exercises: Therapy Ball   Flexion Both;10 reps    ABduction --      Shoulder Exercises: ROM/Strengthening   Wall Wash 1'    Counter level  Manual Therapy   Manual Therapy Soft tissue mobilization;Scapular mobilization   followed by soft tissue and scapular mobilization   Manual therapy comments Manual techniques provided separate from rest of treatment    Myofascial Release Myofascial release and soft tissue mobilization provided to the right arm, trapezius and scapularis region to increase mobility in a pain free zone    Scapular Mobilization Completed supine scapular mobilization techniques during manual techniques to increase overall shoulder and scapular mobility in order to increase functional reaching ability.                   OT Education - 06/07/20 1401    Education Details Discussed pain management and the importance of taking her pain medication as needed to keep her pain level down versus allowing the pain to increase and be unmanaged.    Person(s) Educated Patient    Methods Explanation    Comprehension Verbalized understanding            OT Short Term Goals - 05/31/20 1651        OT SHORT TERM GOAL #1   Title Patient will be educated and independent (with family assisting if needed) with HEP in order to increase the functional use of her RUE during simple waist level self care tasks.    Time 6    Period Weeks    Status On-going    Target Date 07/10/20      OT SHORT TERM GOAL #2   Title Patient will increase her RUE P/ROM to [facility] in order to complete dressing tasks such as pulling a shirt on or off with less difficulty.    Time 6    Period Weeks    Status On-going      OT SHORT TERM GOAL #3   Title Patient will increase her RUE strength to 3/5 in order to complete reaching activities below her shoulder level with less difficulty.    Time 6    Period Weeks    Status On-going      OT SHORT TERM GOAL #4   Title Patient will report a decrease in pain level of 4/10 when completing simple meal prep tasks using her RUE.    Time 6    Period Weeks    Status On-going      OT SHORT TERM GOAL #5   Title Patient will decrease fascial restrictions in her RUE to Moderate amount in order to increase the functional mobility needed to complete grooming tasks.    Time 6    Period Weeks    Status On-going             OT Long Term Goals - 05/31/20 1829      OT LONG TERM GOAL #1   Title Patient will return to using her RUE as her dominant extremity for 75% or more of daily tasks.    Time 12    Period Weeks    Status On-going      OT LONG TERM GOAL #2   Title Patient will increase her RUE A/ROM to [facility] in order to reach into low level cabinets or in the fridge for food items.    Time 12    Period Weeks    Status On-going      OT LONG TERM GOAL #3   Title Patient will increase RUE shoulder strength to 4-/5 in order to complete light housekeeping tasks and prepare to return to driving.    Time 12  Period Weeks    Status On-going      OT LONG TERM GOAL #4   Title Patient will report a decrease in pain level of approximately 2/10 or less when complete basic  daily tasks using her RUE.    Time 12    Period Weeks    Status On-going      OT LONG TERM GOAL #5   Title Patient will decrease fascial restrictions to min amount or less in order to increase functional mobility needed to complete lower dressing tasks.    Time 12    Period Weeks    Status On-going                 Plan - 06/07/20 1402    Clinical Impression Statement A: Patient presented with less edema in RUE with good response to myofascial release and soft tissue mobilization. Able to tolerate slightly further P/ROM. Able to complete hands on AA/ROM protraction and flexion supine. Discussed wiping down counterop or kitchen table at home as part of HEP to further work on RUE movement. VC for form and technique were provided.    Body Structure / Function / Physical Skills ADL;UE functional use;Fascial restriction;Pain;ROM;Decreased knowledge of precautions;IADL;Strength;Edema    Plan P: Continue with either hands on AA/ROM protraction and flexion or attempt holding dowel rod. Continue with gentle manual techniques to address fascial restrictions while incorporating scapular mobilization techniques to increase shoulder mobility.    Consulted and Agree with Plan of Care Patient           Patient will benefit from skilled therapeutic intervention in order to improve the following deficits and impairments:   Body Structure / Function / Physical Skills: ADL, UE functional use, Fascial restriction, Pain, ROM, Decreased knowledge of precautions, IADL, Strength, Edema       Visit Diagnosis: Stiffness of right shoulder, not elsewhere classified  Other symptoms and signs involving the musculoskeletal system  Acute pain of right shoulder    Problem List Patient Active Problem List   Diagnosis Date Noted  . Lichen sclerosus et atrophicus 06/30/2019  . Pneumococcal pneumonia (HCC) 10/28/2012  . Wheezing 10/28/2012  . Hypothyroidism 10/28/2012  . HTN (hypertension), benign  10/28/2012  . Hypercholesteremia 10/28/2012  . Dehydration 10/28/2012   Limmie Patricia, OTR/L,CBIS  [phone]  06/07/2020, 2:17 PM  [facility] [address] [place], [place], [address] Phone: [phone]   Fax:  [phone]  Name: [name] MRN: [id number] Date of Birth: [birth year]

## 2020-06-13 ENCOUNTER — Encounter (HOSPITAL_COMMUNITY): Payer: Self-pay

## 2020-06-13 ENCOUNTER — Other Ambulatory Visit: Payer: Self-pay

## 2020-06-13 ENCOUNTER — Ambulatory Visit (HOSPITAL_COMMUNITY): Payer: PPO

## 2020-06-13 DIAGNOSIS — M25611 Stiffness of right shoulder, not elsewhere classified: Secondary | ICD-10-CM

## 2020-06-13 DIAGNOSIS — R29898 Other symptoms and signs involving the musculoskeletal system: Secondary | ICD-10-CM

## 2020-06-13 DIAGNOSIS — E782 Mixed hyperlipidemia: Secondary | ICD-10-CM | POA: Diagnosis not present

## 2020-06-13 DIAGNOSIS — R7301 Impaired fasting glucose: Secondary | ICD-10-CM | POA: Diagnosis not present

## 2020-06-13 DIAGNOSIS — E039 Hypothyroidism, unspecified: Secondary | ICD-10-CM | POA: Diagnosis not present

## 2020-06-13 DIAGNOSIS — L900 Lichen sclerosus et atrophicus: Secondary | ICD-10-CM | POA: Diagnosis not present

## 2020-06-13 DIAGNOSIS — R7303 Prediabetes: Secondary | ICD-10-CM | POA: Diagnosis not present

## 2020-06-13 DIAGNOSIS — M25511 Pain in right shoulder: Secondary | ICD-10-CM | POA: Diagnosis not present

## 2020-06-13 DIAGNOSIS — Z6829 Body mass index (BMI) 29.0-29.9, adult: Secondary | ICD-10-CM | POA: Diagnosis not present

## 2020-06-13 DIAGNOSIS — R600 Localized edema: Secondary | ICD-10-CM | POA: Diagnosis not present

## 2020-06-13 DIAGNOSIS — I10 Essential (primary) hypertension: Secondary | ICD-10-CM | POA: Diagnosis not present

## 2020-06-13 NOTE — Therapy (Signed)
[facility] [address] [place], [place], [address] Phone: [phone]   Fax:  [phone]  Occupational Therapy Treatment  Patient Details  Name: [name] MRN: [id number] Date of Birth: 1931/06/23 Referring Provider (OT): Fuller Canada, MD   Encounter Date: 06/13/2020   OT End of Session - 06/13/20 1739    Visit Number 4    Number of Visits 24    Date for OT Re-Evaluation 08/21/20   min reassess:06/26/20   Authorization Type [employer] -$15 copay    Progress Note Due on Visit 10    OT Start Time 1655   pt arrived late and then used the bathroom before session.   OT Stop Time 1725    OT Time Calculation (min) 30 min    Activity Tolerance Patient tolerated treatment well;Patient limited by pain    Behavior During Therapy [facility] for tasks assessed/performed   anxious with movement due to pain          Past Medical History:  Diagnosis Date  . COPD (chronic obstructive pulmonary disease) (HCC)   . Hypercholesterolemia   . Hypertension   . Thyroid disease     Past Surgical History:  Procedure Laterality Date  . ABDOMINAL HYSTERECTOMY    . DILATION AND CURETTAGE OF UTERUS    . nephrostomy tube      There were no vitals filed for this visit.   Subjective Assessment - 06/13/20 1716    Subjective  S: It's not doing good today.    Currently in Pain? Yes    Pain Score 5     Pain Location Shoulder    Pain Orientation Right              [facility] OT Assessment - 06/13/20 1733      Assessment   Medical Diagnosis right proximal humerus fracture      Precautions   Precautions Shoulder    Type of Shoulder Precautions Week 5-8 (8/30-9/20): P/ROM/AA/ROM to 90 degrees forward flexion, progress as tolerated, er per tolerance, IR in scapular plane, pullys, no IR behind back or posterior capsule stretch.  Week 9-12 (9/27-10/18) A/ROM and progress to RTC and scapular strengthening by week 12.      Restrictions    Weight Bearing Restrictions Yes    RUE Weight Bearing Non weight bearing                    OT Treatments/Exercises (OP) - 06/13/20 1734      Exercises   Exercises Shoulder      Shoulder Exercises: Supine   Protraction PROM;10 reps;AAROM;5 reps;Limitations    Protraction Limitations 1 finger physical assist with VC for form and technique    Horizontal ABduction PROM;5 reps;Limitations    Horizontal ABduction Limitations low tolerance with increased pain during adduction    External Rotation PROM;10 reps    Internal Rotation PROM;10 reps    Flexion PROM;10 reps;AAROM;5 reps;Limitations    Flexion Limitations Min assist to assist with movement. Able to reach approximately shoulder level    ABduction PROM;10 reps      Shoulder Exercises: ROM/Strengthening   Wall Wash 1'    standing at counter   Other ROM/Strengthening Exercises PVC pipe slide; 10X      Manual Therapy   Manual Therapy Soft tissue mobilization;Scapular mobilization    Manual therapy comments Manual techniques provided separate from rest of treatment    Myofascial Release Myofascial release and soft tissue mobilization provided  to the right arm, trapezius and scapularis region to increase mobility in a pain free zone    Scapular Mobilization Completed supine scapular mobilization techniques during manual techniques to increase overall shoulder and scapular mobility in order to increase functional reaching ability.                     OT Short Term Goals - 05/31/20 1651      OT SHORT TERM GOAL #1   Title Patient will be educated and independent (with family assisting if needed) with HEP in order to increase the functional use of her RUE during simple waist level self care tasks.    Time 6    Period Weeks    Status On-going    Target Date 07/10/20      OT SHORT TERM GOAL #2   Title Patient will increase her RUE P/ROM to [facility] in order to complete dressing tasks such as pulling a shirt on or off  with less difficulty.    Time 6    Period Weeks    Status On-going      OT SHORT TERM GOAL #3   Title Patient will increase her RUE strength to 3/5 in order to complete reaching activities below her shoulder level with less difficulty.    Time 6    Period Weeks    Status On-going      OT SHORT TERM GOAL #4   Title Patient will report a decrease in pain level of 4/10 when completing simple meal prep tasks using her RUE.    Time 6    Period Weeks    Status On-going      OT SHORT TERM GOAL #5   Title Patient will decrease fascial restrictions in her RUE to Moderate amount in order to increase the functional mobility needed to complete grooming tasks.    Time 6    Period Weeks    Status On-going             OT Long Term Goals - 05/31/20 1829      OT LONG TERM GOAL #1   Title Patient will return to using her RUE as her dominant extremity for 75% or more of daily tasks.    Time 12    Period Weeks    Status On-going      OT LONG TERM GOAL #2   Title Patient will increase her RUE A/ROM to [facility] in order to reach into low level cabinets or in the fridge for food items.    Time 12    Period Weeks    Status On-going      OT LONG TERM GOAL #3   Title Patient will increase RUE shoulder strength to 4-/5 in order to complete light housekeeping tasks and prepare to return to driving.    Time 12    Period Weeks    Status On-going      OT LONG TERM GOAL #4   Title Patient will report a decrease in pain level of approximately 2/10 or less when complete basic daily tasks using her RUE.    Time 12    Period Weeks    Status On-going      OT LONG TERM GOAL #5   Title Patient will decrease fascial restrictions to min amount or less in order to increase functional mobility needed to complete lower dressing tasks.    Time 12    Period Weeks    Status On-going  Plan - 06/13/20 1740    Clinical Impression Statement A: Before session, daughter informed OT that  patient has been in a lot of pain recently. She does not have anything prescribed for pain and only takes Ibuprofen (she has taken 4 today) and it is not helping with pain. Suggestion made to call the MD's office and see if he would prescribe anything for pain. While semi reclined, patient's oxygen levels did decline to 89-92%. Initially stayed at 89% until cued to breathe deeping into nose and out mouth. Once seated, oxygen levels increase to 98%-99%. Patient does present with decreased swelling and fascial restrictions of the RUE. Continues to be very guarded with passive stretching and does not tolerate stretching much.    Body Structure / Function / Physical Skills ADL;UE functional use;Fascial restriction;Pain;ROM;Decreased knowledge of precautions;IADL;Strength;Edema    Plan P: Continue with AA/ROM semi-reclined/almost seated for better oxygen levels. Continue manual techniques and scapular mobilization to increase mobility.    Consulted and Agree with Plan of Care Patient           Patient will benefit from skilled therapeutic intervention in order to improve the following deficits and impairments:   Body Structure / Function / Physical Skills: ADL, UE functional use, Fascial restriction, Pain, ROM, Decreased knowledge of precautions, IADL, Strength, Edema       Visit Diagnosis: Other symptoms and signs involving the musculoskeletal system  Acute pain of right shoulder  Stiffness of right shoulder, not elsewhere classified    Problem List Patient Active Problem List   Diagnosis Date Noted  . Lichen sclerosus et atrophicus 06/30/2019  . Pneumococcal pneumonia (HCC) 10/28/2012  . Wheezing 10/28/2012  . Hypothyroidism 10/28/2012  . HTN (hypertension), benign 10/28/2012  . Hypercholesteremia 10/28/2012  . Dehydration 10/28/2012   Limmie Patricia, OTR/L,CBIS  [phone]  06/13/2020, 5:45 PM  [facility] [address]  [place], [place], [address] Phone: [phone]   Fax:  [phone]  Name: MARYCATHERINE MANISCALCO MRN: [id number] Date of Birth: 1931-03-31

## 2020-06-15 ENCOUNTER — Encounter (HOSPITAL_COMMUNITY): Payer: Self-pay | Admitting: Occupational Therapy

## 2020-06-15 ENCOUNTER — Other Ambulatory Visit: Payer: Self-pay

## 2020-06-15 ENCOUNTER — Ambulatory Visit (HOSPITAL_COMMUNITY): Payer: PPO | Admitting: Occupational Therapy

## 2020-06-15 DIAGNOSIS — M25611 Stiffness of right shoulder, not elsewhere classified: Secondary | ICD-10-CM

## 2020-06-15 DIAGNOSIS — R29898 Other symptoms and signs involving the musculoskeletal system: Secondary | ICD-10-CM

## 2020-06-15 DIAGNOSIS — M25511 Pain in right shoulder: Secondary | ICD-10-CM

## 2020-06-15 NOTE — Therapy (Signed)
[facility] [address] [place], [place], [address] Phone: [phone]   Fax:  [phone]  Occupational Therapy Treatment  Patient Details  Name: [name] MRN: [id number] Date of Birth: 07-11-1931 Referring Provider (OT): Fuller Canada, MD   Encounter Date: 06/15/2020   OT End of Session - 06/15/20 1642    Visit Number 5    Number of Visits 24    Date for OT Re-Evaluation 08/21/20   min reassess:06/26/20   Authorization Type [employer] -$15 copay    Progress Note Due on Visit 10    OT Start Time 1432    OT Stop Time 1510    OT Time Calculation (min) 38 min    Activity Tolerance Patient tolerated treatment well;Patient limited by pain    Behavior During Therapy [facility] for tasks assessed/performed   anxious with movement due to pain          Past Medical History:  Diagnosis Date  . COPD (chronic obstructive pulmonary disease) (HCC)   . Hypercholesterolemia   . Hypertension   . Thyroid disease     Past Surgical History:  Procedure Laterality Date  . ABDOMINAL HYSTERECTOMY    . DILATION AND CURETTAGE OF UTERUS    . nephrostomy tube      There were no vitals filed for this visit.   Subjective Assessment - 06/15/20 1432    Subjective  S: I'm taking pain medicine.    Currently in Pain? Yes    Pain Score 5     Pain Location Shoulder    Pain Orientation Right    Pain Descriptors / Indicators Sore    Pain Type Acute pain    Pain Radiating Towards N/A    Pain Onset More than a month ago    Pain Frequency Intermittent    Aggravating Factors  certain movements    Pain Relieving Factors positioning, pain medicine    Effect of Pain on Daily Activities mod effect on ADLs    Multiple Pain Sites No              [facility] OT Assessment - 06/15/20 1431      Assessment   Medical Diagnosis right proximal humerus fracture      Precautions   Precautions Shoulder    Type of Shoulder Precautions Week 5-8  (8/30-9/20): P/ROM/AA/ROM to 90 degrees forward flexion, progress as tolerated, er per tolerance, IR in scapular plane, pullys, no IR behind back or posterior capsule stretch.  Week 9-12 (9/27-10/18) A/ROM and progress to RTC and scapular strengthening by week 12.      Restrictions   Weight Bearing Restrictions Yes    RUE Weight Bearing Non weight bearing                    OT Treatments/Exercises (OP) - 06/15/20 1437      Exercises   Exercises Shoulder      Shoulder Exercises: Supine   Protraction PROM;10 reps;AAROM;5 reps;Limitations    Protraction Limitations 1 finger physical assist with VC for form and technique    Horizontal ABduction PROM;5 reps;Limitations    Horizontal ABduction Limitations guarding    External Rotation PROM;10 reps    Internal Rotation PROM;10 reps    Flexion PROM;10 reps;AAROM;5 reps;Limitations    Flexion Limitations Min assist to assist with movement. Able to reach approximately shoulder level    ABduction PROM;10 reps      Shoulder Exercises: Seated   Extension AROM;10  reps    Row AROM;10 reps      Shoulder Exercises: Pulleys   Flexion 1 minute      Shoulder Exercises: ROM/Strengthening   Wall Wash 1'   standing at counter     Manual Therapy   Manual Therapy Soft tissue mobilization    Manual therapy comments Manual techniques provided separate from rest of treatment    Myofascial Release Myofascial release and soft tissue mobilization provided to the right arm, trapezius and scapularis region to increase mobility in a pain free zone                    OT Short Term Goals - 05/31/20 1651      OT SHORT TERM GOAL #1   Title Patient will be educated and independent (with family assisting if needed) with HEP in order to increase the functional use of her RUE during simple waist level self care tasks.    Time 6    Period Weeks    Status On-going    Target Date 07/10/20      OT SHORT TERM GOAL #2   Title Patient will  increase her RUE P/ROM to [facility] in order to complete dressing tasks such as pulling a shirt on or off with less difficulty.    Time 6    Period Weeks    Status On-going      OT SHORT TERM GOAL #3   Title Patient will increase her RUE strength to 3/5 in order to complete reaching activities below her shoulder level with less difficulty.    Time 6    Period Weeks    Status On-going      OT SHORT TERM GOAL #4   Title Patient will report a decrease in pain level of 4/10 when completing simple meal prep tasks using her RUE.    Time 6    Period Weeks    Status On-going      OT SHORT TERM GOAL #5   Title Patient will decrease fascial restrictions in her RUE to Moderate amount in order to increase the functional mobility needed to complete grooming tasks.    Time 6    Period Weeks    Status On-going             OT Long Term Goals - 05/31/20 1829      OT LONG TERM GOAL #1   Title Patient will return to using her RUE as her dominant extremity for 75% or more of daily tasks.    Time 12    Period Weeks    Status On-going      OT LONG TERM GOAL #2   Title Patient will increase her RUE A/ROM to [facility] in order to reach into low level cabinets or in the fridge for food items.    Time 12    Period Weeks    Status On-going      OT LONG TERM GOAL #3   Title Patient will increase RUE shoulder strength to 4-/5 in order to complete light housekeeping tasks and prepare to return to driving.    Time 12    Period Weeks    Status On-going      OT LONG TERM GOAL #4   Title Patient will report a decrease in pain level of approximately 2/10 or less when complete basic daily tasks using her RUE.    Time 12    Period Weeks    Status On-going  OT LONG TERM GOAL #5   Title Patient will decrease fascial restrictions to min amount or less in order to increase functional mobility needed to complete lower dressing tasks.    Time 12    Period Weeks    Status On-going                  Plan - 06/15/20 1642    Clinical Impression Statement A: Pt reports soreness from elbow to shoulder today. Manual therapy completed at beginning of session with good tolerance from patient. Passive stretching completed within protocol limits and pt able to reduce guarding after first few repetitions with verbal cuing. Pt completed supine AA/ROM with less facilitation today. Added pulleys with mod facilitation for correct completion. Verbal, visual, and tactile cuing throughout session for form and technique.    Body Structure / Function / Physical Skills ADL;UE functional use;Fascial restriction;Pain;ROM;Decreased knowledge of precautions;IADL;Strength;Edema    Plan P: Continue with AA/ROM semi-reclined/almost seated for better oxygen levels. Continue manual techniques and scapular mobilization to increase mobility. Attempt AA/ROM er/IR    OT Home Exercise Plan eval: table slide, elbow A/ROM    Consulted and Agree with Plan of Care Patient           Patient will benefit from skilled therapeutic intervention in order to improve the following deficits and impairments:   Body Structure / Function / Physical Skills: ADL, UE functional use, Fascial restriction, Pain, ROM, Decreased knowledge of precautions, IADL, Strength, Edema       Visit Diagnosis: Other symptoms and signs involving the musculoskeletal system  Acute pain of right shoulder  Stiffness of right shoulder, not elsewhere classified    Problem List Patient Active Problem List   Diagnosis Date Noted  . Lichen sclerosus et atrophicus 06/30/2019  . Pneumococcal pneumonia (HCC) 10/28/2012  . Wheezing 10/28/2012  . Hypothyroidism 10/28/2012  . HTN (hypertension), benign 10/28/2012  . Hypercholesteremia 10/28/2012  . Dehydration 10/28/2012   Ezra Sites, OTR/L  [phone] 06/15/2020, 4:45 PM  [facility] [address] [place], [place], [address] Phone: [phone]   Fax:   [phone]  Name: SHIR BERGMAN MRN: [id number] Date of Birth: 01/08/1931

## 2020-06-20 ENCOUNTER — Ambulatory Visit (HOSPITAL_COMMUNITY): Payer: PPO | Admitting: Occupational Therapy

## 2020-06-20 ENCOUNTER — Encounter (HOSPITAL_COMMUNITY): Payer: Self-pay | Admitting: Occupational Therapy

## 2020-06-20 ENCOUNTER — Other Ambulatory Visit: Payer: Self-pay

## 2020-06-20 DIAGNOSIS — R29898 Other symptoms and signs involving the musculoskeletal system: Secondary | ICD-10-CM

## 2020-06-20 DIAGNOSIS — M25511 Pain in right shoulder: Secondary | ICD-10-CM

## 2020-06-20 DIAGNOSIS — M25611 Stiffness of right shoulder, not elsewhere classified: Secondary | ICD-10-CM

## 2020-06-20 NOTE — Therapy (Signed)
[facility] [address] [place], [place], [address] Phone: [phone]   Fax:  [phone]  Occupational Therapy Treatment  Patient Details  Name: [name] MRN: [id number] Date of Birth: 1930-10-30 Referring Provider (OT): Fuller Canada, MD   Encounter Date: 06/20/2020   OT End of Session - 06/20/20 1513    Visit Number 6    Number of Visits 24    Date for OT Re-Evaluation 08/21/20   min reassess:06/26/20   Authorization Type [employer] -$15 copay    Progress Note Due on Visit 10    OT Start Time 1432    OT Stop Time 1513    OT Time Calculation (min) 41 min    Activity Tolerance Patient tolerated treatment well;Patient limited by pain    Behavior During Therapy [facility] for tasks assessed/performed   anxious with movement due to pain          Past Medical History:  Diagnosis Date  . COPD (chronic obstructive pulmonary disease) (HCC)   . Hypercholesterolemia   . Hypertension   . Thyroid disease     Past Surgical History:  Procedure Laterality Date  . ABDOMINAL HYSTERECTOMY    . DILATION AND CURETTAGE OF UTERUS    . nephrostomy tube      There were no vitals filed for this visit.   Subjective Assessment - 06/20/20 1432    Subjective  S: It sounds like it's making a lot of noise.    Currently in Pain? No/denies              [facility] OT Assessment - 06/20/20 1431      Assessment   Medical Diagnosis right proximal humerus fracture      Precautions   Precautions Shoulder    Type of Shoulder Precautions Week 5-8 (8/30-9/20): P/ROM/AA/ROM to 90 degrees forward flexion, progress as tolerated, er per tolerance, IR in scapular plane, pullys, no IR behind back or posterior capsule stretch.  Week 9-12 (9/27-10/18) A/ROM and progress to RTC and scapular strengthening by week 12.      Restrictions   Weight Bearing Restrictions Yes    RUE Weight Bearing Non weight bearing                    OT  Treatments/Exercises (OP) - 06/20/20 1435      Exercises   Exercises Shoulder      Shoulder Exercises: Supine   Protraction PROM;5 reps;AAROM;10 reps    Horizontal ABduction PROM;5 reps    External Rotation PROM;5 reps;AAROM;10 reps    Internal Rotation PROM;5 reps;AAROM;10 reps    Flexion PROM;5 reps;AAROM;10 reps    ABduction PROM;5 reps      Shoulder Exercises: Seated   Row AROM;10 reps    Protraction AAROM;10 reps      Shoulder Exercises: Pulleys   Flexion 1 minute      Shoulder Exercises: Isometric Strengthening   Flexion 3X5"    Extension 3X5"    External Rotation 3X5"    Internal Rotation 3X5"    ABduction 3X5"    ADduction 3X5"      Manual Therapy   Manual Therapy Soft tissue mobilization    Manual therapy comments Manual techniques provided separate from rest of treatment    Myofascial Release Myofascial release and soft tissue mobilization provided to the right arm, trapezius and scapularis region to increase mobility in a pain free zone  OT Short Term Goals - 05/31/20 1651      OT SHORT TERM GOAL #1   Title Patient will be educated and independent (with family assisting if needed) with HEP in order to increase the functional use of her RUE during simple waist level self care tasks.    Time 6    Period Weeks    Status On-going    Target Date 07/10/20      OT SHORT TERM GOAL #2   Title Patient will increase her RUE P/ROM to [facility] in order to complete dressing tasks such as pulling a shirt on or off with less difficulty.    Time 6    Period Weeks    Status On-going      OT SHORT TERM GOAL #3   Title Patient will increase her RUE strength to 3/5 in order to complete reaching activities below her shoulder level with less difficulty.    Time 6    Period Weeks    Status On-going      OT SHORT TERM GOAL #4   Title Patient will report a decrease in pain level of 4/10 when completing simple meal prep tasks using her RUE.    Time 6      Period Weeks    Status On-going      OT SHORT TERM GOAL #5   Title Patient will decrease fascial restrictions in her RUE to Moderate amount in order to increase the functional mobility needed to complete grooming tasks.    Time 6    Period Weeks    Status On-going             OT Long Term Goals - 05/31/20 1829      OT LONG TERM GOAL #1   Title Patient will return to using her RUE as her dominant extremity for 75% or more of daily tasks.    Time 12    Period Weeks    Status On-going      OT LONG TERM GOAL #2   Title Patient will increase her RUE A/ROM to [facility] in order to reach into low level cabinets or in the fridge for food items.    Time 12    Period Weeks    Status On-going      OT LONG TERM GOAL #3   Title Patient will increase RUE shoulder strength to 4-/5 in order to complete light housekeeping tasks and prepare to return to driving.    Time 12    Period Weeks    Status On-going      OT LONG TERM GOAL #4   Title Patient will report a decrease in pain level of approximately 2/10 or less when complete basic daily tasks using her RUE.    Time 12    Period Weeks    Status On-going      OT LONG TERM GOAL #5   Title Patient will decrease fascial restrictions to min amount or less in order to increase functional mobility needed to complete lower dressing tasks.    Time 12    Period Weeks    Status On-going                 Plan - 06/20/20 1514    Clinical Impression Statement A: Pt reports occasional popping and clicking in her arm, none noted during therapy. Pt with significantly decreased guarding today, allowing for passive stretching to approximately 60% ROM. Pt was able to complete AA/ROM exercises without tactile  assist from OT, occasional corrective assist provided for form. Added protraction in sitting, also added isometrics today. Verbal cuing for form and technique, rest breaks provided as needed.    Body Structure / Function / Physical Skills ADL;UE  functional use;Fascial restriction;Pain;ROM;Decreased knowledge of precautions;IADL;Strength;Edema    Plan P: Continue with AA/ROM semi-reclined/almost seated for better oxygen levels. Continue manual techniques and scapular mobilization to increase mobility. Continue with isometrics    OT Home Exercise Plan eval: table slide, elbow A/ROM    Consulted and Agree with Plan of Care Patient           Patient will benefit from skilled therapeutic intervention in order to improve the following deficits and impairments:   Body Structure / Function / Physical Skills: ADL, UE functional use, Fascial restriction, Pain, ROM, Decreased knowledge of precautions, IADL, Strength, Edema       Visit Diagnosis: Other symptoms and signs involving the musculoskeletal system  Acute pain of right shoulder  Stiffness of right shoulder, not elsewhere classified    Problem List Patient Active Problem List   Diagnosis Date Noted  . Lichen sclerosus et atrophicus 06/30/2019  . Pneumococcal pneumonia (HCC) 10/28/2012  . Wheezing 10/28/2012  . Hypothyroidism 10/28/2012  . HTN (hypertension), benign 10/28/2012  . Hypercholesteremia 10/28/2012  . Dehydration 10/28/2012   Ezra Sites, OTR/L  [phone] 06/20/2020, 3:16 PM  [facility] [address] [place], [place], [address] Phone: [phone]   Fax:  [phone]  Name: EEVA SCHLOSSER MRN: [id number] Date of Birth: 1931/06/10

## 2020-06-22 ENCOUNTER — Ambulatory Visit (HOSPITAL_COMMUNITY): Payer: PPO | Admitting: Occupational Therapy

## 2020-06-22 ENCOUNTER — Encounter (HOSPITAL_COMMUNITY): Payer: Self-pay | Admitting: Occupational Therapy

## 2020-06-22 ENCOUNTER — Other Ambulatory Visit: Payer: Self-pay

## 2020-06-22 DIAGNOSIS — M25511 Pain in right shoulder: Secondary | ICD-10-CM

## 2020-06-22 DIAGNOSIS — R29898 Other symptoms and signs involving the musculoskeletal system: Secondary | ICD-10-CM

## 2020-06-22 DIAGNOSIS — M25611 Stiffness of right shoulder, not elsewhere classified: Secondary | ICD-10-CM

## 2020-06-22 NOTE — Therapy (Addendum)
[facility] [address] [place], [place], [address] Phone: [phone]   Fax:  [phone]  Occupational Therapy Treatment & Discharge Summary  Patient Details  Name: [name] MRN: [id number] Date of Birth: 1931-01-12 Referring Provider (OT): Arther Abbott, MD   Encounter Date: 06/22/2020   OT End of Session - 06/22/20 1535    Visit Number 7    Number of Visits 24    Date for OT Re-Evaluation 08/21/20   min reassess:06/26/20   Authorization Type [employer] -$15 copay    Progress Note Due on Visit 10    OT Start Time 1433    OT Stop Time 1511    OT Time Calculation (min) 38 min    Activity Tolerance Patient tolerated treatment well;Patient limited by pain    Behavior During Therapy [facility] for tasks assessed/performed   anxious with movement due to pain          Past Medical History:  Diagnosis Date  . COPD (chronic obstructive pulmonary disease) ([place])   . Hypercholesterolemia   . Hypertension   . Thyroid disease     Past Surgical History:  Procedure Laterality Date  . ABDOMINAL HYSTERECTOMY    . DILATION AND CURETTAGE OF UTERUS    . nephrostomy tube      There were no vitals filed for this visit.   Subjective Assessment - 06/22/20 1438    Subjective  S: I've been loading up on Aspirin.    Currently in Pain? Yes    Pain Score 3     Pain Location Shoulder    Pain Orientation Right    Pain Descriptors / Indicators Sore    Pain Type Acute pain    Pain Radiating Towards N/A    Pain Onset More than a month ago    Pain Frequency Intermittent    Aggravating Factors  movement    Pain Relieving Factors positioning, Aspirin    Effect of Pain on Daily Activities mod effect on ADLs    Multiple Pain Sites No              [facility] OT Assessment - 06/22/20 1438      Assessment   Medical Diagnosis right proximal humerus fracture      Precautions   Precautions Shoulder    Type of Shoulder Precautions  Week 5-8 (8/30-9/20): P/ROM/AA/ROM to 90 degrees forward flexion, progress as tolerated, er per tolerance, IR in scapular plane, pullys, no IR behind back or posterior capsule stretch.  Week 9-12 (9/27-10/18) A/ROM and progress to RTC and scapular strengthening by week 12.      Restrictions   Weight Bearing Restrictions Yes    RUE Weight Bearing Non weight bearing                    OT Treatments/Exercises (OP) - 06/22/20 1449      Exercises   Exercises Shoulder      Shoulder Exercises: Supine   Protraction PROM;5 reps;AAROM;10 reps    Horizontal ABduction PROM;5 reps    External Rotation PROM;5 reps;AAROM;10 reps    Internal Rotation PROM;5 reps;AAROM;10 reps    Flexion PROM;5 reps;AAROM;10 reps    ABduction PROM;5 reps      Shoulder Exercises: Seated   Protraction AAROM;10 reps    Flexion AAROM;10 reps      Shoulder Exercises: ROM/Strengthening   Wall Wash 1'      Shoulder Exercises: Isometric Strengthening   Flexion 5X5"  Extension 5X5"    External Rotation 5X5"    Internal Rotation 5X5"    ABduction 5X5"    ADduction 5X5"      Manual Therapy   Manual Therapy Soft tissue mobilization    Manual therapy comments Manual techniques provided separate from rest of treatment    Myofascial Release Myofascial release and soft tissue mobilization provided to the right arm, trapezius and scapularis region to increase mobility in a pain free zone                    OT Short Term Goals - 05/31/20 1651      OT SHORT TERM GOAL #1   Title Patient will be educated and independent (with family assisting if needed) with HEP in order to increase the functional use of her RUE during simple waist level self care tasks.    Time 6    Period Weeks    Status On-going    Target Date 07/10/20      OT SHORT TERM GOAL #2   Title Patient will increase her RUE P/ROM to [facility] in order to complete dressing tasks such as pulling a shirt on or off with less difficulty.     Time 6    Period Weeks    Status On-going      OT SHORT TERM GOAL #3   Title Patient will increase her RUE strength to 3/5 in order to complete reaching activities below her shoulder level with less difficulty.    Time 6    Period Weeks    Status On-going      OT SHORT TERM GOAL #4   Title Patient will report a decrease in pain level of 4/10 when completing simple meal prep tasks using her RUE.    Time 6    Period Weeks    Status On-going      OT SHORT TERM GOAL #5   Title Patient will decrease fascial restrictions in her RUE to Moderate amount in order to increase the functional mobility needed to complete grooming tasks.    Time 6    Period Weeks    Status On-going             OT Long Term Goals - 05/31/20 1829      OT LONG TERM GOAL #1   Title Patient will return to using her RUE as her dominant extremity for 75% or more of daily tasks.    Time 12    Period Weeks    Status On-going      OT LONG TERM GOAL #2   Title Patient will increase her RUE A/ROM to [facility] in order to reach into low level cabinets or in the fridge for food items.    Time 12    Period Weeks    Status On-going      OT LONG TERM GOAL #3   Title Patient will increase RUE shoulder strength to 4-/5 in order to complete light housekeeping tasks and prepare to return to driving.    Time 12    Period Weeks    Status On-going      OT LONG TERM GOAL #4   Title Patient will report a decrease in pain level of approximately 2/10 or less when complete basic daily tasks using her RUE.    Time 12    Period Weeks    Status On-going      OT LONG TERM GOAL #5   Title Patient will  decrease fascial restrictions to min amount or less in order to increase functional mobility needed to complete lower dressing tasks.    Time 12    Period Weeks    Status On-going                 Plan - 06/22/20 1536    Clinical Impression Statement A: Pt reports minimal soreness today with movement. Continued with manual  techniques to address fascial restrictions as well as passive stretching. Pt completing supine AA/ROM exercises with verbal cuing for form, continued with seated AA/ROM and added flexion. Also increased isometrics to 5x5". Progressed to wall wash on the wall.    Body Structure / Function / Physical Skills ADL;UE functional use;Fascial restriction;Pain;ROM;Decreased knowledge of precautions;IADL;Strength;Edema    Plan P: Mini-reassessment. Continue with AA/ROM and isometrics, attempt pvc pipe slide    OT Home Exercise Plan eval: table slide, elbow A/ROM    Consulted and Agree with Plan of Care Patient           Patient will benefit from skilled therapeutic intervention in order to improve the following deficits and impairments:   Body Structure / Function / Physical Skills: ADL, UE functional use, Fascial restriction, Pain, ROM, Decreased knowledge of precautions, IADL, Strength, Edema       Visit Diagnosis: Other symptoms and signs involving the musculoskeletal system  Acute pain of right shoulder  Stiffness of right shoulder, not elsewhere classified    Problem List Patient Active Problem List   Diagnosis Date Noted  . Lichen sclerosus et atrophicus 06/30/2019  . Pneumococcal pneumonia ([place]) 10/28/2012  . Wheezing 10/28/2012  . Hypothyroidism 10/28/2012  . HTN (hypertension), benign 10/28/2012  . Hypercholesteremia 10/28/2012  . Dehydration 10/28/2012   Guadelupe Sabin, OTR/L  [phone] 06/22/2020, 3:38 PM  [facility] [place], [place], [address] Phone: [phone]   Fax:  [phone]  Name: [name] MRN: [id number] Date of Birth: 1931-08-18    OCCUPATIONAL THERAPY DISCHARGE SUMMARY  Visits from Start of Care: 7  Current functional level related to goals / functional outcomes: Pt has been hospitalized for hypoxia and will discharge with [facility] OT services, therefore discharging from OP OT and will  require new order for returning to OP therapy services. Pt has improved RUE functioning to achieve ROM WFL during passive stretching and AA/ROM exercises. Pt is using RUE for activities below shoulder level, is unable to perform active reaching shoulder level and above at this time.     Remaining deficits: Decreased ROM, strength, functional use; increased pain and fascial restrictions   Education / Equipment: HEP for gentle stretching Plan: Patient agrees to discharge.  Patient goals were not met. Patient is being discharged due to a change in medical status.  ?????

## 2020-06-25 ENCOUNTER — Other Ambulatory Visit: Payer: Self-pay

## 2020-06-25 ENCOUNTER — Emergency Department (HOSPITAL_COMMUNITY): Payer: PPO

## 2020-06-25 ENCOUNTER — Ambulatory Visit
Admission: EM | Admit: 2020-06-25 | Discharge: 2020-06-25 | Disposition: A | Discharge status: Home / Self Care | Payer: PPO

## 2020-06-25 ENCOUNTER — Inpatient Hospital Stay (HOSPITAL_COMMUNITY)
Admission: EM | Admit: 2020-06-25 | Discharge: 2020-06-28 | DRG: 193 | Disposition: A | Discharge status: Hospice | Payer: PPO | Attending: Family Medicine | Admitting: Family Medicine

## 2020-06-25 ENCOUNTER — Encounter (HOSPITAL_COMMUNITY): Payer: Self-pay | Admitting: Emergency Medicine

## 2020-06-25 DIAGNOSIS — R5381 Other malaise: Secondary | ICD-10-CM | POA: Diagnosis not present

## 2020-06-25 DIAGNOSIS — Z90710 Acquired absence of both cervix and uterus: Secondary | ICD-10-CM | POA: Diagnosis not present

## 2020-06-25 DIAGNOSIS — J441 Chronic obstructive pulmonary disease with (acute) exacerbation: Secondary | ICD-10-CM | POA: Diagnosis present

## 2020-06-25 DIAGNOSIS — E7800 Pure hypercholesterolemia, unspecified: Secondary | ICD-10-CM | POA: Diagnosis not present

## 2020-06-25 DIAGNOSIS — R1310 Dysphagia, unspecified: Secondary | ICD-10-CM | POA: Diagnosis not present

## 2020-06-25 DIAGNOSIS — I110 Hypertensive heart disease with heart failure: Secondary | ICD-10-CM | POA: Diagnosis present

## 2020-06-25 DIAGNOSIS — J8410 Pulmonary fibrosis, unspecified: Secondary | ICD-10-CM | POA: Diagnosis present

## 2020-06-25 DIAGNOSIS — I10 Essential (primary) hypertension: Secondary | ICD-10-CM

## 2020-06-25 DIAGNOSIS — E876 Hypokalemia: Secondary | ICD-10-CM | POA: Diagnosis present

## 2020-06-25 DIAGNOSIS — J9601 Acute respiratory failure with hypoxia: Secondary | ICD-10-CM | POA: Diagnosis not present

## 2020-06-25 DIAGNOSIS — R410 Disorientation, unspecified: Secondary | ICD-10-CM | POA: Diagnosis not present

## 2020-06-25 DIAGNOSIS — J189 Pneumonia, unspecified organism: Principal | ICD-10-CM | POA: Diagnosis present

## 2020-06-25 DIAGNOSIS — E785 Hyperlipidemia, unspecified: Secondary | ICD-10-CM | POA: Diagnosis present

## 2020-06-25 DIAGNOSIS — Z515 Encounter for palliative care: Secondary | ICD-10-CM | POA: Diagnosis not present

## 2020-06-25 DIAGNOSIS — M6281 Muscle weakness (generalized): Secondary | ICD-10-CM | POA: Diagnosis not present

## 2020-06-25 DIAGNOSIS — I5031 Acute diastolic (congestive) heart failure: Secondary | ICD-10-CM | POA: Diagnosis not present

## 2020-06-25 DIAGNOSIS — E039 Hypothyroidism, unspecified: Secondary | ICD-10-CM | POA: Diagnosis present

## 2020-06-25 DIAGNOSIS — R0600 Dyspnea, unspecified: Secondary | ICD-10-CM

## 2020-06-25 DIAGNOSIS — Z66 Do not resuscitate: Secondary | ICD-10-CM | POA: Diagnosis not present

## 2020-06-25 DIAGNOSIS — I509 Heart failure, unspecified: Secondary | ICD-10-CM | POA: Diagnosis present

## 2020-06-25 DIAGNOSIS — J440 Chronic obstructive pulmonary disease with acute lower respiratory infection: Secondary | ICD-10-CM | POA: Diagnosis present

## 2020-06-25 DIAGNOSIS — Z20822 Contact with and (suspected) exposure to covid-19: Secondary | ICD-10-CM | POA: Diagnosis present

## 2020-06-25 DIAGNOSIS — J181 Lobar pneumonia, unspecified organism: Secondary | ICD-10-CM | POA: Diagnosis not present

## 2020-06-25 DIAGNOSIS — Z79899 Other long term (current) drug therapy: Secondary | ICD-10-CM

## 2020-06-25 LAB — CBC WITH DIFFERENTIAL/PLATELET
Abs Immature Granulocytes: 0.03 10*3/uL (ref 0.00–0.07)
Basophils Absolute: 0 10*3/uL (ref 0.0–0.1)
Basophils Relative: 0 %
Eosinophils Absolute: 0.1 10*3/uL (ref 0.0–0.5)
Eosinophils Relative: 1 %
HCT: 38.5 % (ref 36.0–46.0)
Hemoglobin: 11.8 g/dL — ABNORMAL LOW (ref 12.0–15.0)
Immature Granulocytes: 0 %
Lymphocytes Relative: 21 %
Lymphs Abs: 1.8 10*3/uL (ref 0.7–4.0)
MCH: 28.5 pg (ref 26.0–34.0)
MCHC: 30.6 g/dL (ref 30.0–36.0)
MCV: 93 fL (ref 80.0–100.0)
Monocytes Absolute: 0.6 10*3/uL (ref 0.1–1.0)
Monocytes Relative: 7 %
Neutro Abs: 5.8 10*3/uL (ref 1.7–7.7)
Neutrophils Relative %: 71 %
Platelets: 171 10*3/uL (ref 150–400)
RBC: 4.14 MIL/uL (ref 3.87–5.11)
RDW: 14.4 % (ref 11.5–15.5)
WBC: 8.3 10*3/uL (ref 4.0–10.5)
nRBC: 0 % (ref 0.0–0.2)

## 2020-06-25 LAB — RESPIRATORY PANEL BY RT PCR (FLU A&B, COVID)
Influenza A by PCR: NEGATIVE
Influenza B by PCR: NEGATIVE
SARS Coronavirus 2 by RT PCR: NEGATIVE

## 2020-06-25 LAB — TROPONIN I (HIGH SENSITIVITY)
Troponin I (High Sensitivity): 47 ng/L — ABNORMAL HIGH (ref <18)
Troponin I (High Sensitivity): 44 ng/L — ABNORMAL HIGH (ref <18)

## 2020-06-25 LAB — BASIC METABOLIC PANEL
Anion gap: 10 (ref 5–15)
BUN: 23 mg/dL (ref 8–23)
CO2: 31 mmol/L (ref 22–32)
Calcium: 10 mg/dL (ref 8.9–10.3)
Chloride: 98 mmol/L (ref 98–111)
Creatinine, Ser: 0.68 mg/dL (ref 0.44–1.00)
GFR calc Af Amer: >60 mL/min (ref >60)
GFR calc non Af Amer: >60 mL/min (ref >60)
Glucose, Bld: 112 mg/dL — ABNORMAL HIGH (ref 70–99)
Potassium: 3.2 mmol/L — ABNORMAL LOW (ref 3.5–5.1)
Sodium: 139 mmol/L (ref 135–145)

## 2020-06-25 LAB — URINALYSIS, ROUTINE W REFLEX MICROSCOPIC
Bilirubin Urine: NEGATIVE
Glucose, UA: NEGATIVE mg/dL
Hgb urine dipstick: NEGATIVE
Ketones, ur: NEGATIVE mg/dL
Nitrite: NEGATIVE
Protein, ur: >=300 mg/dL — AB
Specific Gravity, Urine: 1.015 (ref 1.005–1.030)
pH: 6 (ref 5.0–8.0)

## 2020-06-25 LAB — D-DIMER, QUANTITATIVE: D-Dimer, Quant: 0.59 ug/mL-FEU — ABNORMAL HIGH (ref 0.00–0.50)

## 2020-06-25 LAB — HEPATIC FUNCTION PANEL
ALT: 18 U/L (ref 0–44)
AST: 32 U/L (ref 15–41)
Albumin: 3.8 g/dL (ref 3.5–5.0)
Alkaline Phosphatase: 42 U/L (ref 38–126)
Bilirubin, Direct: 0.3 mg/dL — ABNORMAL HIGH (ref 0.0–0.2)
Indirect Bilirubin: 0.6 mg/dL (ref 0.3–0.9)
Total Bilirubin: 0.9 mg/dL (ref 0.3–1.2)
Total Protein: 7.1 g/dL (ref 6.5–8.1)

## 2020-06-25 LAB — LACTIC ACID, PLASMA
Lactic Acid, Venous: 0.9 mmol/L (ref 0.5–1.9)
Lactic Acid, Venous: 0.9 mmol/L (ref 0.5–1.9)

## 2020-06-25 LAB — BRAIN NATRIURETIC PEPTIDE: B Natriuretic Peptide: 365 pg/mL — ABNORMAL HIGH (ref 0.0–100.0)

## 2020-06-25 MED ORDER — SODIUM CHLORIDE 0.9% FLUSH: 3.0000 mL | INTRAVENOUS | Status: DC | PRN

## 2020-06-25 MED ORDER — IPRATROPIUM-ALBUTEROL 0.5-2.5 (3) MG/3ML IN SOLN
3.0000 mL | Freq: Four times a day (QID) | RESPIRATORY_TRACT | Status: DC
  Administered 2020-06-26 – 2020-06-27 (×6): 3 mL via RESPIRATORY_TRACT
  Filled 2020-06-25 (×5): qty 3

## 2020-06-25 MED ORDER — SODIUM CHLORIDE 0.9 % IV SOLN
1.0000 g | INTRAVENOUS | Status: DC
  Administered 2020-06-26 – 2020-06-27 (×2): 1 g via INTRAVENOUS
  Filled 2020-06-25 (×2): qty 10

## 2020-06-25 MED ORDER — ENOXAPARIN SODIUM 40 MG/0.4ML ~~LOC~~ SOLN
40.0000 mg | SUBCUTANEOUS | Status: DC
  Administered 2020-06-25 – 2020-06-26 (×2): 40 mg via SUBCUTANEOUS
  Filled 2020-06-25 (×2): qty 0.4

## 2020-06-25 MED ORDER — GUAIFENESIN ER 600 MG PO TB12
600.0000 mg | ORAL_TABLET | Freq: Two times a day (BID) | ORAL | Status: DC
  Administered 2020-06-25 – 2020-06-27 (×4): 600 mg via ORAL
  Filled 2020-06-25 (×4): qty 1

## 2020-06-25 MED ORDER — ONDANSETRON HCL 4 MG PO TABS: 4.0000 mg | ORAL_TABLET | Freq: Four times a day (QID) | ORAL | Status: DC | PRN

## 2020-06-25 MED ORDER — ACETAMINOPHEN 500 MG PO TABS
500.0000 mg | ORAL_TABLET | Freq: Four times a day (QID) | ORAL | Status: DC | PRN
  Administered 2020-06-26: 500 mg via ORAL
  Filled 2020-06-25: qty 1

## 2020-06-25 MED ORDER — MONTELUKAST SODIUM 10 MG PO TABS
10.0000 mg | ORAL_TABLET | Freq: Every day | ORAL | Status: DC
  Administered 2020-06-25 – 2020-06-27 (×3): 10 mg via ORAL
  Filled 2020-06-25 (×3): qty 1

## 2020-06-25 MED ORDER — SODIUM CHLORIDE 0.9 % IV SOLN
1.0000 g | Freq: Once | INTRAVENOUS | Status: AC
  Administered 2020-06-25: 1 g via INTRAVENOUS
  Filled 2020-06-25: qty 10

## 2020-06-25 MED ORDER — SODIUM CHLORIDE 0.9 % IV SOLN
500.0000 mg | INTRAVENOUS | Status: DC
  Administered 2020-06-25 – 2020-06-27 (×3): 500 mg via INTRAVENOUS
  Filled 2020-06-25 (×3): qty 500

## 2020-06-25 MED ORDER — METHYLPREDNISOLONE SODIUM SUCC 40 MG IJ SOLR
40.0000 mg | Freq: Four times a day (QID) | INTRAMUSCULAR | Status: DC
  Administered 2020-06-25 – 2020-06-26 (×4): 40 mg via INTRAVENOUS
  Filled 2020-06-25 (×4): qty 1

## 2020-06-25 MED ORDER — SODIUM CHLORIDE 0.9% FLUSH
3.0000 mL | Freq: Two times a day (BID) | INTRAVENOUS | Status: DC
  Administered 2020-06-26 – 2020-06-27 (×3): 3 mL via INTRAVENOUS

## 2020-06-25 MED ORDER — ONDANSETRON HCL 4 MG/2ML IJ SOLN: 4.0000 mg | Freq: Four times a day (QID) | INTRAMUSCULAR | Status: DC | PRN

## 2020-06-25 MED ORDER — IPRATROPIUM BROMIDE 0.02 % IN SOLN
0.5000 mg | Freq: Four times a day (QID) | RESPIRATORY_TRACT | Status: DC
  Administered 2020-06-25: 0.5 mg via RESPIRATORY_TRACT
  Filled 2020-06-25: qty 2.5

## 2020-06-25 MED ORDER — LEVOTHYROXINE SODIUM 75 MCG PO TABS
75.0000 ug | ORAL_TABLET | Freq: Every morning | ORAL | Status: DC
  Administered 2020-06-26: 75 ug via ORAL
  Filled 2020-06-25: qty 2

## 2020-06-25 MED ORDER — SODIUM CHLORIDE 0.9 % IV SOLN
INTRAVENOUS | Status: DC | PRN
  Administered 2020-06-25 – 2020-06-26 (×2): 250 mL via INTRAVENOUS

## 2020-06-25 MED ORDER — SODIUM CHLORIDE 0.9 % IV SOLN: 250.0000 mL | INTRAVENOUS | Status: DC | PRN

## 2020-06-25 MED ORDER — PROPRANOLOL HCL 20 MG PO TABS
10.0000 mg | ORAL_TABLET | Freq: Two times a day (BID) | ORAL | Status: DC
  Administered 2020-06-25 – 2020-06-27 (×4): 10 mg via ORAL
  Filled 2020-06-25 (×5): qty 1

## 2020-06-25 MED ORDER — FENOFIBRATE 160 MG PO TABS
160.0000 mg | ORAL_TABLET | Freq: Every day | ORAL | Status: DC
  Administered 2020-06-26 – 2020-06-27 (×2): 160 mg via ORAL
  Filled 2020-06-25 (×5): qty 1

## 2020-06-25 MED ORDER — ALBUTEROL SULFATE (2.5 MG/3ML) 0.083% IN NEBU
2.5000 mg | INHALATION_SOLUTION | Freq: Four times a day (QID) | RESPIRATORY_TRACT | Status: DC
  Administered 2020-06-25: 2.5 mg via RESPIRATORY_TRACT
  Filled 2020-06-25: qty 3

## 2020-06-25 MED ORDER — LISINOPRIL 10 MG PO TABS
40.0000 mg | ORAL_TABLET | Freq: Every day | ORAL | Status: DC
  Administered 2020-06-25 – 2020-06-27 (×3): 40 mg via ORAL
  Filled 2020-06-25 (×4): qty 4

## 2020-06-25 MED ORDER — POTASSIUM CHLORIDE CRYS ER 20 MEQ PO TBCR
40.0000 meq | EXTENDED_RELEASE_TABLET | Freq: Once | ORAL | Status: AC
  Administered 2020-06-25: 40 meq via ORAL
  Filled 2020-06-25: qty 2

## 2020-06-25 NOTE — ED Triage Notes (Signed)
Pt has had cough and shortness of breath x 4 days

## 2020-06-25 NOTE — ED Notes (Signed)
Patient is being discharged from the Urgent Care and sent to the Emergency Department via pov. Per K. Avegno, patient is in need of higher level of care due to hypoxia. Patient is aware and verbalizes understanding of plan of care.  Vitals:   06/25/20 1057  SpO2: (!) 79%

## 2020-06-25 NOTE — ED Notes (Signed)
Multiple IV attempts to obtain 20g IV for CT Chest Angio study ordered, all unsuccessful. Family requesting for pt to not be stuck anymore to obtain appropriate size IV and have CT done without contrast. Dr. Sharl Ma notified.

## 2020-06-25 NOTE — H&P (Signed)
TRH H&P    Patient Demographics:    [name], is a 86 y.o. female  MRN: [id number]  DOB - [birth year]  Admit Date - 06/25/2020  Referring MD/NP/PA: Jacalyn Lefevre  Outpatient Primary MD for the patient is Benita Stabile, MD  Patient coming from: Home  Chief complaint-shortness of breath   HPI:    [name]  is a 86 y.o. female, with history of COPD, hyperlipidemia, hypertension, hypothyroidism came to hospital with worsening shortness of breath for past few days.  Patient has history of COPD but is not on home oxygen.  Patient's daughter took her to urgent care she was found to have O2 sats of 82% on room air.  She was sent to the ED.  Patient is a poor historian.  She is complaining of shortness of breath for past 3 to 4 months.  Also has been coughing up phlegm.  No history of chest pain, no nausea vomiting or diarrhea.  In the ED SARS-CoV-2 19 test was negative.  Patient has not been vaccinated against Covid 19 virus.    Review of systems:    In addition to the HPI above,   All other systems reviewed and are negative.    Past History of the following :    Past Medical History:  Diagnosis Date  . COPD (chronic obstructive pulmonary disease) (HCC)   . Hypercholesterolemia   . Hypertension   . Thyroid disease       Past Surgical History:  Procedure Laterality Date  . ABDOMINAL HYSTERECTOMY    . DILATION AND CURETTAGE OF UTERUS    . nephrostomy tube        Social History:      Social History   Tobacco Use  . Smoking status: Never Smoker  . Smokeless tobacco: Never Used  Substance Use Topics  . Alcohol use: No       Family History :   Unobtainable, patient is a poor historian   Home Medications:   Prior to Admission medications   Medication Sig Start Date End Date Taking? Authorizing Provider  acetaminophen (TYLENOL) 500 MG tablet Take 500 mg by mouth every 6  (six) hours as needed.   Yes [provider]  fenofibrate micronized (LOFIBRA) 134 MG capsule Take 134 mg by mouth daily.   Yes [provider]  levothyroxine (SYNTHROID) 75 MCG tablet Take 75 mcg by mouth every morning. 10/19/19  Yes [provider]  lisinopril (ZESTRIL) 40 MG tablet Take 40 mg by mouth daily.   Yes [provider]  montelukast (SINGULAIR) 10 MG tablet Take 10 mg by mouth daily.   Yes [provider]  propranolol (INDERAL) 10 MG tablet Take 10 mg by mouth 2 (two) times daily.   Yes [provider]     Allergies:    No Known Allergies   Physical Exam:   Vitals  Blood pressure (!) 157/63, pulse 68, temperature 98 F (36.7 C), temperature source Oral, resp. rate 18, height  (1.651 m), weight 70.3 kg, SpO2 95 %.  1.  General: Appears in no acute distress  2. Psychiatric: Alert, oriented x3, intact insight and judgment  3. Neurologic: Cranial nerves II through XII grossly intact, no focal deficit noted  4. HEENMT:  Atraumatic normocephalic, extraocular muscles are intact  5. Respiratory : Bilateral rhonchi auscultated  6. Cardiovascular : S1-S2, regular, no murmur auscultated, 2+ pitting edema noted in lower extremities  7. Gastrointestinal:  Abdomen is soft, nontender, no organomegaly     Data Review:    CBC Recent Labs  Lab 06/25/20 1203  WBC 8.3  HGB 11.8*  HCT 38.5  PLT 171  MCV 93.0  MCH 28.5  MCHC 30.6  RDW 14.4  LYMPHSABS 1.8  MONOABS 0.6  EOSABS 0.1  BASOSABS 0.0   ------------------------------------------------------------------------------------------------------------------  Results for orders placed or performed during the hospital encounter of 06/25/20 (from the past 48 hour(s))  Respiratory Panel by RT PCR (Flu A&B, Covid) - Nasopharyngeal Swab     Status: None   Collection Time: 06/25/20 11:31 AM   Specimen: Nasopharyngeal Swab  Result Value Ref Range   SARS  Coronavirus 2 by RT PCR NEGATIVE NEGATIVE    Comment: (NOTE) SARS-CoV-2 target nucleic acids are NOT DETECTED.  The SARS-CoV-2 RNA is generally detectable in upper respiratoy specimens during the acute phase of infection. The lowest concentration of SARS-CoV-2 viral copies this assay can detect is 131 copies/mL. A negative result does not preclude SARS-Cov-2 infection and should not be used as the sole basis for treatment or other patient management decisions. A negative result may occur with  improper specimen collection/handling, submission of specimen other than nasopharyngeal swab, presence of viral mutation(s) within the areas targeted by this assay, and inadequate number of viral copies (<131 copies/mL). A negative result must be combined with clinical observations, patient history, and epidemiological information. The expected result is Negative.  Fact Sheet for Patients:  https://www.moore.com/  Fact Sheet for Healthcare Providers:  https://www.young.biz/  This test is no t yet approved or cleared by the [place] FDA and  has been authorized for detection and/or diagnosis of SARS-CoV-2 by FDA under an Emergency Use Authorization (EUA). This EUA will remain  in effect (meaning this test can be used) for the duration of the COVID-19 declaration under Section 564(b)(1) of the Act, 21 U.S.C. section 360bbb-3(b)(1), unless the authorization is terminated or revoked sooner.     Influenza A by PCR NEGATIVE NEGATIVE   Influenza B by PCR NEGATIVE NEGATIVE    Comment: (NOTE) The Xpert Xpress SARS-CoV-2/FLU/RSV assay is intended as an aid in  the diagnosis of influenza from Nasopharyngeal swab specimens and  should not be used as a sole basis for treatment. Nasal washings and  aspirates are unacceptable for Xpert Xpress SARS-CoV-2/FLU/RSV  testing.  Fact Sheet for Patients: https://www.moore.com/  Fact Sheet for  Healthcare Providers: https://www.young.biz/  This test is not yet approved or cleared by the [place] FDA and  has been authorized for detection and/or diagnosis of SARS-CoV-2 by  FDA under an Emergency Use Authorization (EUA). This EUA will remain  in effect (meaning this test can be used) for the duration of the  Covid-19 declaration under Section 564(b)(1) of the Act, 21  U.S.C. section 360bbb-3(b)(1), unless the authorization is  terminated or revoked. Performed at [facility], [address], [place], [place] [address]   Culture, blood (routine x 2)     Status: None (Preliminary result)   Collection Time: 06/25/20 11:36 AM   Specimen: BLOOD  Result Value Ref  Range   Specimen Description BLOOD    Special Requests NONE    Culture      NO GROWTH <12 HOURS Performed at [facility], [address] [place], [place] [address]    Report Status PENDING   Basic metabolic panel     Status: Abnormal   Collection Time: 06/25/20 12:03 PM  Result Value Ref Range   Sodium 139 135 - 145 mmol/L   Potassium 3.2 (L) 3.5 - 5.1 mmol/L   Chloride 98 98 - 111 mmol/L   CO2 31 22 - 32 mmol/L   Glucose, Bld 112 (H) 70 - 99 mg/dL    Comment: Glucose reference range applies only to samples taken after fasting for at least 8 hours.   BUN 23 8 - 23 mg/dL   Creatinine, Ser 0.35 0.44 - 1.00 mg/dL   Calcium 46.5 8.9 - 68.1 mg/dL   GFR calc non Af Amer >60 >60 mL/min   GFR calc Af Amer >60 >60 mL/min   Anion gap 10 5 - 15    Comment: Performed at [facility], [address] [place], [place] [address]  Brain natriuretic peptide     Status: Abnormal   Collection Time: 06/25/20 12:03 PM  Result Value Ref Range   B Natriuretic Peptide 365.0 (H) 0.0 - 100.0 pg/mL    Comment: Performed at [facility], [address], [place], [place] [address]  CBC with Differential     Status: Abnormal   Collection Time: 06/25/20 12:03 PM  Result Value Ref Range   WBC 8.3 4.0 - 10.5 K/uL   RBC  4.14 3.87 - 5.11 MIL/uL   Hemoglobin 11.8 (L) 12.0 - 15.0 g/dL   HCT 94.4 36 - 46 %   MCV 93.0 80.0 - 100.0 fL   MCH 28.5 26.0 - 34.0 pg   MCHC 30.6 30.0 - 36.0 g/dL   RDW 96.7 59.1 - 63.8 %   Platelets 171 150 - 400 K/uL   nRBC 0.0 0.0 - 0.2 %   Neutrophils Relative % 71 %   Neutro Abs 5.8 1.7 - 7.7 K/uL   Lymphocytes Relative 21 %   Lymphs Abs 1.8 0.7 - 4.0 K/uL   Monocytes Relative 7 %   Monocytes Absolute 0.6 0 - 1 K/uL   Eosinophils Relative 1 %   Eosinophils Absolute 0.1 0 - 0 K/uL   Basophils Relative 0 %   Basophils Absolute 0.0 0 - 0 K/uL   Immature Granulocytes 0 %   Abs Immature Granulocytes 0.03 0.00 - 0.07 K/uL    Comment: Performed at [facility], [address], [place], [place] [address]  Lactic acid, plasma     Status: None   Collection Time: 06/25/20 12:03 PM  Result Value Ref Range   Lactic Acid, Venous 0.9 0.5 - 1.9 mmol/L    Comment: Performed at [facility], [address], [place], [place] [address]  Troponin I (High Sensitivity)     Status: Abnormal   Collection Time: 06/25/20 12:03 PM  Result Value Ref Range   Troponin I (High Sensitivity) 47 (H) <18 ng/L    Comment: (NOTE) Elevated high sensitivity troponin I (hsTnI) values and significant  changes across serial measurements may suggest ACS but many other  chronic and acute conditions are known to elevate hsTnI results.  Refer to the "Links" section for chest pain algorithms and additional  guidance. Performed at [facility], [address] [place], [place] [address]   Hepatic function panel  Status: Abnormal   Collection Time: 06/25/20 12:03 PM  Result Value Ref Range   Total Protein 7.1 6.5 - 8.1 g/dL   Albumin 3.8 3.5 - 5.0 g/dL   AST 32 15 - 41 U/L   ALT 18 0 - 44 U/L   Alkaline Phosphatase 42 38 - 126 U/L   Total Bilirubin 0.9 0.3 - 1.2 mg/dL   Bilirubin, Direct 0.3 (H) 0.0 - 0.2 mg/dL   Indirect Bilirubin 0.6 0.3 - 0.9 mg/dL    Comment: Performed at [facility], [address] [place], [place] [address]  Culture, blood (routine x 2)     Status: None (Preliminary result)   Collection Time: 06/25/20 12:18 PM   Specimen: BLOOD LEFT ARM  Result Value Ref Range   Specimen Description BLOOD LEFT ARM BOTTLES DRAWN AEROBIC AND ANAEROBIC    Special Requests Blood Culture adequate volume    Culture      NO GROWTH < 12 HOURS Performed at [facility], [address] [place], [place] [address]    Report Status PENDING   Lactic acid, plasma     Status: None   Collection Time: 06/25/20  2:45 PM  Result Value Ref Range   Lactic Acid, Venous 0.9 0.5 - 1.9 mmol/L    Comment: Performed at [facility], [address] [place], [place] [address]    Chemistries  Recent Labs  Lab 06/25/20 1203  NA 139  K 3.2*  CL 98  CO2 31  GLUCOSE 112*  BUN 23  CREATININE 0.68  CALCIUM 10.0  AST 32  ALT 18  ALKPHOS 42  BILITOT 0.9   ------------------------------------------------------------------------------------------------------------------  ------------------------------------------------------------------------------------------------------------------ GFR: Estimated Creatinine Clearance: 49.6 mL/min (by C-G formula based on SCr of 0.68 mg/dL). Liver Function Tests: Recent Labs  Lab 06/25/20 1203  AST 32  ALT 18  ALKPHOS 42  BILITOT 0.9  PROT 7.1  ALBUMIN 3.8    --------------------------------------------------------------------------------------------------------------- Urine analysis:    Component Value Date/Time   COLORURINE YELLOW 07/31/2013 0608   APPEARANCEUR CLEAR 07/31/2013 0608   LABSPEC 1.025 07/31/2013 0608   PHURINE 5.5 07/31/2013 0608   GLUCOSEU NEGATIVE 07/31/2013 0608   HGBUR NEGATIVE 07/31/2013 0608   BILIRUBINUR NEGATIVE 07/31/2013 0608   KETONESUR NEGATIVE 07/31/2013 0608   PROTEINUR NEGATIVE 07/31/2013 0608   UROBILINOGEN 0.2 07/31/2013 0608   NITRITE NEGATIVE 07/31/2013 0608   LEUKOCYTESUR NEGATIVE 07/31/2013 [id number]      Imaging  Results:    DG Chest Port 1 View  Result Date: 06/25/2020 CLINICAL DATA:  Patient with decreased oxygen saturation.  Cough. EXAM: PORTABLE CHEST 1 VIEW COMPARISON:  Chest radiograph 07/31/2013. FINDINGS: Monitoring leads overlie the patient. Stable cardiomegaly. Patchy consolidative opacity within the left lower lobe. No pleural effusion or pneumothorax. Incompletely visualized proximal right humerus fracture. Thoracic spine degenerative changes. IMPRESSION: 1. Patchy consolidation left lower lobe may represent atelectasis or infection. Followup PA and lateral chest X-ray is recommended in 3-4 weeks following trial of antibiotic therapy to ensure resolution and exclude underlying malignancy. 2. Redemonstrated proximal right humerus fracture. Electronically Signed   By: Annia Beltrew  Davis M.D.   On: 06/25/2020 12:48    My personal review of EKG: Rhythm NSR, no ST changes   Assessment & Plan:    Active Problems:   CAP (community acquired pneumonia)   1. Community-acquired pneumonia-chest x-ray shows patchy consolidation in left lower lobe which may represent atelectasis or infection.  Patient started on ceftriaxone and Zithromax.  Follow blood culture results.  Will obtain  sputum culture, urinary strep pneumo antigen. 2. COPD exacerbation-patient does have bilateral rhonchi auscultated, she is currently requiring 2 L/min of oxygen.  Will start Solu-Medrol 40 mg IV every 6 hours, DuoNeb nebulizers every 6 hours.  Mucinex 600 g p.o. twice daily. 3. Hypokalemia-potassium 3.2, will give 1 dose of K. Dur 40 mEq p.o. x1.  Follow BMP in am. 4. Hypothyroidism-continue Synthroid. 5. Hyperlipidemia-continue fenofibrate. 6. Hypertension-continue lisinopril 40 mg p.o. daily, Inderal 10 mg p.o. twice daily. 7. Bilateral lower extremity edema-likely chronic venous insufficiency.  She does have elevated BNP 325.  Will obtain echocardiogram in a.m. to rule out underlying cor pulmonale versus diastolic  dysfunction.    DVT Prophylaxis-   Lovenox  AM Labs Ordered, also please review Full Orders  Family Communication: Admission, patients condition and plan of care including tests being ordered have been discussed with the patient  who indicate understanding and agree with the plan and Code Status.  Code Status: Full code  Admission status: Observation     Time spent in minutes : 60 minutes   Meredeth Ide M.D    [facility]

## 2020-06-25 NOTE — ED Triage Notes (Signed)
Pt sent over by UC for low oxygen saturation (80s). Pt accompanied by daughter. Pt has hx of COPD, but not on home oxygen. Daughter reports congested cough since Thursday. Denies fever. Sats 88%-90% on RA. Denies COVID exposure and did not receive COVID vaccine.

## 2020-06-25 NOTE — ED Provider Notes (Signed)
[facility] EMERGENCY DEPARTMENT Provider Note   CSN: [id number] Arrival date & time: 06/25/20  1107     History Chief Complaint  Patient presents with  . Hypoxia    [name] is a 86 y.o. female.  Pt presents to the ED today with sob.  Pt's daughter gives the hx.  Pt has been sob for the past few days.  She does have COPD, but is not on home O2.  Daughter took her to UC and RA O2 sat was 82%, so she was sent here.  Pt has had a cough.  No fever.  She has not been vaccinated against Covid.        Past Medical History:  Diagnosis Date  . COPD (chronic obstructive pulmonary disease) (HCC)   . Hypercholesterolemia   . Hypertension   . Thyroid disease     Patient Active Problem List   Diagnosis Date Noted  . Lichen sclerosus et atrophicus 06/30/2019  . Pneumococcal pneumonia (HCC) 10/28/2012  . Wheezing 10/28/2012  . Hypothyroidism 10/28/2012  . HTN (hypertension), benign 10/28/2012  . Hypercholesteremia 10/28/2012  . Dehydration 10/28/2012    Past Surgical History:  Procedure Laterality Date  . ABDOMINAL HYSTERECTOMY    . DILATION AND CURETTAGE OF UTERUS    . nephrostomy tube       OB History    Gravida  2   Para  2   Term  2   Preterm      AB      Living  2     SAB      TAB      Ectopic      Multiple      Live Births  2           No family history on file.  Social History   Tobacco Use  . Smoking status: Never Smoker  . Smokeless tobacco: Never Used  Substance Use Topics  . Alcohol use: No  . Drug use: No    Home Medications Prior to Admission medications   Medication Sig Start Date End Date Taking? Authorizing Provider  acetaminophen (TYLENOL) 500 MG tablet Take 500 mg by mouth every 6 (six) hours as needed.   Yes [provider]  fenofibrate micronized (LOFIBRA) 134 MG capsule Take 134 mg by mouth daily.   Yes [provider]  levothyroxine (SYNTHROID) 75 MCG tablet Take 75 mcg by mouth every  morning. 10/19/19  Yes [provider]  lisinopril (ZESTRIL) 40 MG tablet Take 40 mg by mouth daily.   Yes [provider]  montelukast (SINGULAIR) 10 MG tablet Take 10 mg by mouth daily.   Yes [provider]  propranolol (INDERAL) 10 MG tablet Take 10 mg by mouth 2 (two) times daily.   Yes [provider]    Allergies    Patient has no known allergies.  Review of Systems   Review of Systems  Respiratory: Positive for cough and shortness of breath.   All other systems reviewed and are negative.   Physical Exam Updated Vital Signs BP (!) 157/63   Pulse 68   Temp 98 F (36.7 C) (Oral)   Resp 18   Ht 5\' 5"  (1.651 m)   Wt 70.3 kg   SpO2 95%   BMI 25.79 kg/m   Physical Exam Vitals and nursing note reviewed.  Constitutional:      General: She is in acute distress.     Appearance: She is ill-appearing.  HENT:     Head: Normocephalic and atraumatic.     Right Ear: External ear normal.     Left Ear: External ear normal.     Nose: Nose normal.     Mouth/Throat:     Mouth: Mucous membranes are dry.  Eyes:     Extraocular Movements: Extraocular movements intact.     Conjunctiva/sclera: Conjunctivae normal.     Pupils: Pupils are equal, round, and reactive to light.  Cardiovascular:     Rate and Rhythm: Normal rate and regular rhythm.     Pulses: Normal pulses.     Heart sounds: Normal heart sounds.  Pulmonary:     Effort: Tachypnea present.     Breath sounds: Rhonchi present.  Abdominal:     General: Abdomen is flat. Bowel sounds are normal.     Palpations: Abdomen is soft.  Musculoskeletal:        General: Normal range of motion.     Cervical back: Normal range of motion and neck supple.  Skin:    General: Skin is warm.     Capillary Refill: Capillary refill takes less than 2 seconds.  Neurological:     Mental Status: She is alert. Mental status is at baseline.  Psychiatric:        Mood and Affect: Mood is anxious.     ED  Results / Procedures / Treatments   Labs (all labs ordered are listed, but only abnormal results are displayed) Labs Reviewed  BASIC METABOLIC PANEL - Abnormal; Notable for the following components:      Result Value   Potassium 3.2 (*)    Glucose, Bld 112 (*)    All other components within normal limits  BRAIN NATRIURETIC PEPTIDE - Abnormal; Notable for the following components:   B Natriuretic Peptide 365.0 (*)    All other components within normal limits  CBC WITH DIFFERENTIAL/PLATELET - Abnormal; Notable for the following components:   Hemoglobin 11.8 (*)    All other components within normal limits  HEPATIC FUNCTION PANEL - Abnormal; Notable for the following components:   Bilirubin, Direct 0.3 (*)    All other components within normal limits  TROPONIN I (HIGH SENSITIVITY) - Abnormal; Notable for the following components:   Troponin I (High Sensitivity) 47 (*)    All other components within normal limits  CULTURE, BLOOD (ROUTINE X 2)  CULTURE, BLOOD (ROUTINE X 2)  RESPIRATORY PANEL BY RT PCR (FLU A&B, COVID)  LACTIC ACID, PLASMA  LACTIC ACID, PLASMA  URINALYSIS, ROUTINE W REFLEX MICROSCOPIC  TROPONIN I (HIGH SENSITIVITY)    EKG EKG Interpretation  Date/Time:  Sunday June 25 2020 11:31:23 EDT Ventricular Rate:  74 PR Interval:    QRS Duration: 108 QT Interval:  442 QTC Calculation: 491 R Axis:   103 Text Interpretation: Sinus rhythm Right axis deviation Borderline prolonged QT interval No significant change since last tracing Confirmed by Jacalyn LefevreHaviland, Marce Schartz [phone]([id number]) on 06/25/2020 2:37:39 PM   Radiology DG Chest Port 1 View  Result Date: 06/25/2020 CLINICAL DATA:  Patient with decreased oxygen saturation.  Cough. EXAM: PORTABLE CHEST 1 VIEW COMPARISON:  Chest radiograph 07/31/2013. FINDINGS: Monitoring leads overlie the patient. Stable cardiomegaly. Patchy consolidative opacity within the left lower lobe. No pleural effusion or pneumothorax. Incompletely visualized  proximal right humerus fracture. Thoracic spine degenerative changes. IMPRESSION: 1. Patchy consolidation left lower lobe may represent atelectasis or infection. Followup PA and lateral chest X-ray is recommended in 3-4 weeks following trial of antibiotic therapy to ensure  resolution and exclude underlying malignancy. 2. Redemonstrated proximal right humerus fracture. Electronically Signed   By: Annia Belt M.D.   On: 06/25/2020 12:48    Procedures Procedures (including critical care time)  Medications Ordered in ED Medications  cefTRIAXone (ROCEPHIN) 1 g in sodium chloride 0.9 % 100 mL IVPB (has no administration in time range)  azithromycin (ZITHROMAX) 500 mg in sodium chloride 0.9 % 250 mL IVPB (has no administration in time range)  potassium chloride SA (KLOR-CON) CR tablet 40 mEq (has no administration in time range)  0.9 %  sodium chloride infusion (has no administration in time range)    ED Course  I have reviewed the triage vital signs and the nursing notes.  Pertinent labs & imaging results that were available during my care of the patient were reviewed by me and considered in my medical decision making (see chart for details).    MDM Rules/Calculators/A&P                          Pt placed on 4L oxygen and O2 sats are back up to the mid-90s.  She looks more comfortable and less anxious.  Pt does have a LLL pna.  Pt given rocephin and zithromax.  Covid neg.  I added on a CT chest for PE as pt was very hypoxic and Covid neg.  This is pending on admission.  Pt d/w Dr. Sharl Ma (triad) for admission.  [name] was evaluated in Emergency Department on 06/25/2020 for the symptoms described in the history of present illness. She was evaluated in the context of the global COVID-19 pandemic, which necessitated consideration that the patient might be at risk for infection with the SARS-CoV-2 virus that causes COVID-19. Institutional protocols and algorithms that pertain to the  evaluation of patients at risk for COVID-19 are in a state of rapid change based on information released by regulatory bodies including the CDC and federal and state organizations. These policies and algorithms were followed during the patient's care in the ED.  CRITICAL CARE Performed by: Jacalyn Lefevre   Total critical care time: 30 minutes  Critical care time was exclusive of separately billable procedures and treating other patients.  Critical care was necessary to treat or prevent imminent or life-threatening deterioration.  Critical care was time spent personally by me on the following activities: development of treatment plan with patient and/or surrogate as well as nursing, discussions with consultants, evaluation of patient's response to treatment, examination of patient, obtaining history from patient or surrogate, ordering and performing treatments and interventions, ordering and review of laboratory studies, ordering and review of radiographic studies, pulse oximetry and re-evaluation of patient's condition.  [name] was evaluated in Emergency Department on 06/25/2020 for the symptoms described in the history of present illness. She was evaluated in the context of the global COVID-19 pandemic, which necessitated consideration that the patient might be at risk for infection with the SARS-CoV-2 virus that causes COVID-19. Institutional protocols and algorithms that pertain to the evaluation of patients at risk for COVID-19 are in a state of rapid change based on information released by regulatory bodies including the CDC and federal and state organizations. These policies and algorithms were followed during the patient's care in the ED.  Final Clinical Impression(s) / ED Diagnoses Final diagnoses:  Community acquired pneumonia of left lower lobe of lung  Acute respiratory failure with hypoxia (HCC)    Rx / DC Orders ED Discharge  Orders    None       Jacalyn Lefevre,  MD 06/25/20 1527

## 2020-06-26 ENCOUNTER — Observation Stay (HOSPITAL_COMMUNITY): Payer: PPO

## 2020-06-26 DIAGNOSIS — E7800 Pure hypercholesterolemia, unspecified: Secondary | ICD-10-CM | POA: Diagnosis present

## 2020-06-26 DIAGNOSIS — Z90710 Acquired absence of both cervix and uterus: Secondary | ICD-10-CM | POA: Diagnosis not present

## 2020-06-26 DIAGNOSIS — R5381 Other malaise: Secondary | ICD-10-CM | POA: Diagnosis present

## 2020-06-26 DIAGNOSIS — J441 Chronic obstructive pulmonary disease with (acute) exacerbation: Secondary | ICD-10-CM | POA: Diagnosis present

## 2020-06-26 DIAGNOSIS — R410 Disorientation, unspecified: Secondary | ICD-10-CM | POA: Diagnosis not present

## 2020-06-26 DIAGNOSIS — Z79899 Other long term (current) drug therapy: Secondary | ICD-10-CM | POA: Diagnosis not present

## 2020-06-26 DIAGNOSIS — Z20822 Contact with and (suspected) exposure to covid-19: Secondary | ICD-10-CM | POA: Diagnosis present

## 2020-06-26 DIAGNOSIS — E785 Hyperlipidemia, unspecified: Secondary | ICD-10-CM | POA: Diagnosis present

## 2020-06-26 DIAGNOSIS — I110 Hypertensive heart disease with heart failure: Secondary | ICD-10-CM | POA: Diagnosis present

## 2020-06-26 DIAGNOSIS — Z515 Encounter for palliative care: Secondary | ICD-10-CM | POA: Diagnosis not present

## 2020-06-26 DIAGNOSIS — J189 Pneumonia, unspecified organism: Secondary | ICD-10-CM | POA: Diagnosis not present

## 2020-06-26 DIAGNOSIS — E039 Hypothyroidism, unspecified: Secondary | ICD-10-CM | POA: Diagnosis present

## 2020-06-26 DIAGNOSIS — J9601 Acute respiratory failure with hypoxia: Secondary | ICD-10-CM | POA: Diagnosis present

## 2020-06-26 DIAGNOSIS — Z66 Do not resuscitate: Secondary | ICD-10-CM | POA: Diagnosis not present

## 2020-06-26 DIAGNOSIS — R1310 Dysphagia, unspecified: Secondary | ICD-10-CM | POA: Diagnosis present

## 2020-06-26 DIAGNOSIS — I509 Heart failure, unspecified: Secondary | ICD-10-CM | POA: Diagnosis present

## 2020-06-26 DIAGNOSIS — I5031 Acute diastolic (congestive) heart failure: Secondary | ICD-10-CM

## 2020-06-26 DIAGNOSIS — J8410 Pulmonary fibrosis, unspecified: Secondary | ICD-10-CM | POA: Diagnosis present

## 2020-06-26 DIAGNOSIS — J440 Chronic obstructive pulmonary disease with acute lower respiratory infection: Secondary | ICD-10-CM | POA: Diagnosis present

## 2020-06-26 DIAGNOSIS — M6281 Muscle weakness (generalized): Secondary | ICD-10-CM | POA: Diagnosis not present

## 2020-06-26 DIAGNOSIS — E876 Hypokalemia: Secondary | ICD-10-CM | POA: Diagnosis present

## 2020-06-26 LAB — ECHOCARDIOGRAM COMPLETE
AR max vel: 2.66 cm2
AV Area VTI: 2.26 cm2
AV Area mean vel: 2.33 cm2
AV Mean grad: 5.8 mmHg
AV Peak grad: 10.3 mmHg
Ao pk vel: 1.6 m/s
Area-P 1/2: 6.32 cm2
Height: 63 in
P 1/2 time: 368 ms
S' Lateral: 2.9 cm
Weight: 2518.54 [oz_av]

## 2020-06-26 LAB — COMPREHENSIVE METABOLIC PANEL
ALT: 16 U/L (ref 0–44)
AST: 28 U/L (ref 15–41)
Albumin: 3.3 g/dL — ABNORMAL LOW (ref 3.5–5.0)
Alkaline Phosphatase: 36 U/L — ABNORMAL LOW (ref 38–126)
Anion gap: 10 (ref 5–15)
BUN: 24 mg/dL — ABNORMAL HIGH (ref 8–23)
CO2: 31 mmol/L (ref 22–32)
Calcium: 9.7 mg/dL (ref 8.9–10.3)
Chloride: 101 mmol/L (ref 98–111)
Creatinine, Ser: 0.67 mg/dL (ref 0.44–1.00)
GFR calc Af Amer: >60 mL/min (ref >60)
GFR calc non Af Amer: >60 mL/min (ref >60)
Glucose, Bld: 134 mg/dL — ABNORMAL HIGH (ref 70–99)
Potassium: 4 mmol/L (ref 3.5–5.1)
Sodium: 142 mmol/L (ref 135–145)
Total Bilirubin: 0.8 mg/dL (ref 0.3–1.2)
Total Protein: 6.6 g/dL (ref 6.5–8.1)

## 2020-06-26 LAB — CBC
HCT: 36.2 % (ref 36.0–46.0)
Hemoglobin: 11 g/dL — ABNORMAL LOW (ref 12.0–15.0)
MCH: 28.9 pg (ref 26.0–34.0)
MCHC: 30.4 g/dL (ref 30.0–36.0)
MCV: 95 fL (ref 80.0–100.0)
Platelets: 167 10*3/uL (ref 150–400)
RBC: 3.81 MIL/uL — ABNORMAL LOW (ref 3.87–5.11)
RDW: 14.7 % (ref 11.5–15.5)
WBC: 5.7 10*3/uL (ref 4.0–10.5)
nRBC: 0 % (ref 0.0–0.2)

## 2020-06-26 LAB — STREP PNEUMONIAE URINARY ANTIGEN: Strep Pneumo Urinary Antigen: NEGATIVE

## 2020-06-26 MED ORDER — LABETALOL HCL 5 MG/ML IV SOLN
10.0000 mg | INTRAVENOUS | Status: DC | PRN
  Administered 2020-06-26: 10 mg via INTRAVENOUS
  Filled 2020-06-26: qty 4

## 2020-06-26 MED ORDER — LEVOTHYROXINE SODIUM 75 MCG PO TABS
75.0000 ug | ORAL_TABLET | Freq: Every day | ORAL | Status: DC
  Administered 2020-06-27: 75 ug via ORAL
  Filled 2020-06-26: qty 1

## 2020-06-26 MED ORDER — MELATONIN 3 MG PO TABS
6.0000 mg | ORAL_TABLET | Freq: Every evening | ORAL | Status: DC | PRN
  Administered 2020-06-26: 6 mg via ORAL
  Filled 2020-06-26: qty 2

## 2020-06-26 MED ORDER — PREDNISONE 20 MG PO TABS
40.0000 mg | ORAL_TABLET | Freq: Every day | ORAL | Status: DC
  Administered 2020-06-27: 40 mg via ORAL
  Filled 2020-06-26: qty 2

## 2020-06-26 MED ORDER — FUROSEMIDE 10 MG/ML IJ SOLN
40.0000 mg | Freq: Two times a day (BID) | INTRAMUSCULAR | Status: DC
  Administered 2020-06-26 – 2020-06-27 (×2): 40 mg via INTRAVENOUS
  Filled 2020-06-26 (×3): qty 4

## 2020-06-26 NOTE — ED Notes (Signed)
ED TO INPATIENT HANDOFF REPORT  ED Nurse Name and Phone #:  [phone]  S Name/Age/Gender [name] 86 y.o. female Room/Bed: APA14/APA14  Code Status   Code Status: Full Code  Home/SNF/Other Home Patient oriented to: self Is this baseline? Yes   Triage Complete: Triage complete  Chief Complaint CAP (community acquired pneumonia) [J18.9]  Triage Note Pt sent over by UC for low oxygen saturation (80s). Pt accompanied by daughter. Pt has hx of COPD, but not on home oxygen. Daughter reports congested cough since Thursday. Denies fever. Sats 88%-90% on RA. Denies COVID exposure and did not receive COVID vaccine.     Allergies No Known Allergies  Level of Care/Admitting Diagnosis ED Disposition    ED Disposition Condition Comment   Admit  Hospital Area: [facility] [100103]  Level of Care: Med-Surg [16]  Covid Evaluation: Confirmed COVID Negative  Diagnosis: CAP (community acquired pneumonia) [607371]  Admitting Physician: Lovie Chol  Attending Physician: Lovie Chol       B Medical/Surgery History Past Medical History:  Diagnosis Date  . COPD (chronic obstructive pulmonary disease) (HCC)   . Hypercholesterolemia   . Hypertension   . Thyroid disease    Past Surgical History:  Procedure Laterality Date  . ABDOMINAL HYSTERECTOMY    . DILATION AND CURETTAGE OF UTERUS    . nephrostomy tube       A IV Location/Drains/Wounds Patient Lines/Drains/Airways Status    Active Line/Drains/Airways    Name Placement date Placement time Site Days   Peripheral IV 06/26/20 Right Forearm 06/26/20  0209  Forearm  less than 1          Intake/Output Last 24 hours  Intake/Output Summary (Last 24 hours) at 06/26/2020 0840 Last data filed at 06/25/2020 1849 Gross per 24 hour  Intake 425.39 ml  Output --  Net 425.39 ml    Labs/Imaging Results for orders placed or performed during the hospital encounter of 06/25/20 (from the past 48  hour(s))  Respiratory Panel by RT PCR (Flu A&B, Covid) - Nasopharyngeal Swab     Status: None   Collection Time: 06/25/20 11:31 AM   Specimen: Nasopharyngeal Swab  Result Value Ref Range   SARS Coronavirus 2 by RT PCR NEGATIVE NEGATIVE    Comment: (NOTE) SARS-CoV-2 target nucleic acids are NOT DETECTED.  The SARS-CoV-2 RNA is generally detectable in upper respiratoy specimens during the acute phase of infection. The lowest concentration of SARS-CoV-2 viral copies this assay can detect is 131 copies/mL. A negative result does not preclude SARS-Cov-2 infection and should not be used as the sole basis for treatment or other patient management decisions. A negative result may occur with  improper specimen collection/handling, submission of specimen other than nasopharyngeal swab, presence of viral mutation(s) within the areas targeted by this assay, and inadequate number of viral copies (<131 copies/mL). A negative result must be combined with clinical observations, patient history, and epidemiological information. The expected result is Negative.  Fact Sheet for Patients:  https://www.moore.com/  Fact Sheet for Healthcare Providers:  https://www.young.biz/  This test is no t yet approved or cleared by the [place] FDA and  has been authorized for detection and/or diagnosis of SARS-CoV-2 by FDA under an Emergency Use Authorization (EUA). This EUA will remain  in effect (meaning this test can be used) for the duration of the COVID-19 declaration under Section 564(b)(1) of the Act, 21 U.S.C. section 360bbb-3(b)(1), unless the authorization is terminated or revoked sooner.  Influenza A by PCR NEGATIVE NEGATIVE   Influenza B by PCR NEGATIVE NEGATIVE    Comment: (NOTE) The Xpert Xpress SARS-CoV-2/FLU/RSV assay is intended as an aid in  the diagnosis of influenza from Nasopharyngeal swab specimens and  should not be used as a sole basis  for treatment. Nasal washings and  aspirates are unacceptable for Xpert Xpress SARS-CoV-2/FLU/RSV  testing.  Fact Sheet for Patients: https://www.moore.com/  Fact Sheet for Healthcare Providers: https://www.young.biz/  This test is not yet approved or cleared by the [place] FDA and  has been authorized for detection and/or diagnosis of SARS-CoV-2 by  FDA under an Emergency Use Authorization (EUA). This EUA will remain  in effect (meaning this test can be used) for the duration of the  Covid-19 declaration under Section 564(b)(1) of the Act, 21  U.S.C. section 360bbb-3(b)(1), unless the authorization is  terminated or revoked. Performed at [facility], [address] [place], [place] [address]   Culture, blood (routine x 2)     Status: None (Preliminary result)   Collection Time: 06/25/20 11:36 AM   Specimen: Vein; Blood  Result Value Ref Range   Specimen Description      LEFT ANTECUBITAL BOTTLES DRAWN AEROBIC AND ANAEROBIC   Special Requests Blood Culture adequate volume    Culture      NO GROWTH <12 HOURS Performed at [facility], [address] [place], [place] [address]    Report Status PENDING   Basic metabolic panel     Status: Abnormal   Collection Time: 06/25/20 12:03 PM  Result Value Ref Range   Sodium 139 135 - 145 mmol/L   Potassium 3.2 (L) 3.5 - 5.1 mmol/L   Chloride 98 98 - 111 mmol/L   CO2 31 22 - 32 mmol/L   Glucose, Bld 112 (H) 70 - 99 mg/dL    Comment: Glucose reference range applies only to samples taken after fasting for at least 8 hours.   BUN 23 8 - 23 mg/dL   Creatinine, Ser 1.69 0.44 - 1.00 mg/dL   Calcium 67.8 8.9 - 93.8 mg/dL   GFR calc non Af Amer >60 >60 mL/min   GFR calc Af Amer >60 >60 mL/min   Anion gap 10 5 - 15    Comment: Performed at [facility], [address], [place], [place] [address]  Brain natriuretic peptide     Status: Abnormal   Collection Time: 06/25/20 12:03 PM  Result Value Ref  Range   B Natriuretic Peptide 365.0 (H) 0.0 - 100.0 pg/mL    Comment: Performed at [facility], [address] [place], [place] [address]  CBC with Differential     Status: Abnormal   Collection Time: 06/25/20 12:03 PM  Result Value Ref Range   WBC 8.3 4.0 - 10.5 K/uL   RBC 4.14 3.87 - 5.11 MIL/uL   Hemoglobin 11.8 (L) 12.0 - 15.0 g/dL   HCT 52.7 36 - 46 %   MCV 93.0 80.0 - 100.0 fL   MCH 28.5 26.0 - 34.0 pg   MCHC 30.6 30.0 - 36.0 g/dL   RDW 78.2 42.3 - 53.6 %   Platelets 171 150 - 400 K/uL   nRBC 0.0 0.0 - 0.2 %   Neutrophils Relative % 71 %   Neutro Abs 5.8 1.7 - 7.7 K/uL   Lymphocytes Relative 21 %   Lymphs Abs 1.8 0.7 - 4.0 K/uL   Monocytes Relative 7 %   Monocytes Absolute 0.6 0 - 1 K/uL  Eosinophils Relative 1 %   Eosinophils Absolute 0.1 0 - 0 K/uL   Basophils Relative 0 %   Basophils Absolute 0.0 0 - 0 K/uL   Immature Granulocytes 0 %   Abs Immature Granulocytes 0.03 0.00 - 0.07 K/uL    Comment: Performed at [facility], [address] [place], [place] [address]  Lactic acid, plasma     Status: None   Collection Time: 06/25/20 12:03 PM  Result Value Ref Range   Lactic Acid, Venous 0.9 0.5 - 1.9 mmol/L    Comment: Performed at [facility], [address], [place], [place] [address]  Troponin I (High Sensitivity)     Status: Abnormal   Collection Time: 06/25/20 12:03 PM  Result Value Ref Range   Troponin I (High Sensitivity) 47 (H) <18 ng/L    Comment: (NOTE) Elevated high sensitivity troponin I (hsTnI) values and significant  changes across serial measurements may suggest ACS but many other  chronic and acute conditions are known to elevate hsTnI results.  Refer to the "Links" section for chest pain algorithms and additional  guidance. Performed at [facility], [address] [place], [place] [address]   Hepatic function panel     Status: Abnormal   Collection Time: 06/25/20 12:03 PM  Result Value Ref Range   Total Protein 7.1 6.5 - 8.1 g/dL   Albumin 3.8  3.5 - 5.0 g/dL   AST 32 15 - 41 U/L   ALT 18 0 - 44 U/L   Alkaline Phosphatase 42 38 - 126 U/L   Total Bilirubin 0.9 0.3 - 1.2 mg/dL   Bilirubin, Direct 0.3 (H) 0.0 - 0.2 mg/dL   Indirect Bilirubin 0.6 0.3 - 0.9 mg/dL    Comment: Performed at [facility], [address], [place], [place] [address]  D-dimer, quantitative (not at [facility])     Status: Abnormal   Collection Time: 06/25/20 12:03 PM  Result Value Ref Range   D-Dimer, Quant 0.59 (H) 0.00 - 0.50 ug/mL-FEU    Comment: (NOTE) At the manufacturer cut-off of 0.50 ug/mL FEU, this assay has been documented to exclude PE with a sensitivity and negative predictive value of 97 to 99%.  At this time, this assay has not been approved by the FDA to exclude DVT/VTE. Results should be correlated with clinical presentation. Performed at [facility], [address] [place], [place] [address]   Culture, blood (routine x 2)     Status: None (Preliminary result)   Collection Time: 06/25/20 12:18 PM   Specimen: Vein; Blood  Result Value Ref Range   Specimen Description BLOOD LEFT ARM BOTTLES DRAWN AEROBIC AND ANAEROBIC    Special Requests Blood Culture adequate volume    Culture      NO GROWTH < 12 HOURS Performed at [facility], [address] [place], [place] [address]    Report Status PENDING   Lactic acid, plasma     Status: None   Collection Time: 06/25/20  2:45 PM  Result Value Ref Range   Lactic Acid, Venous 0.9 0.5 - 1.9 mmol/L    Comment: Performed at [facility], [address], [place], [place] [address]  Troponin I (High Sensitivity)     Status: Abnormal   Collection Time: 06/25/20  2:45 PM  Result Value Ref Range   Troponin I (High Sensitivity) 44 (H) <18 ng/L    Comment: (NOTE) Elevated high sensitivity troponin I (hsTnI) values and significant  changes across serial measurements may suggest ACS but many  other  chronic and acute conditions are known to elevate hsTnI results.  Refer to the "Links" section for chest pain  algorithms and additional  guidance. Performed at [facility], [address] [place], [place] [address]   Urinalysis, Routine w reflex microscopic Urine, Clean Catch     Status: Abnormal   Collection Time: 06/25/20  6:02 PM  Result Value Ref Range   Color, Urine YELLOW YELLOW   APPearance HAZY (A) CLEAR   Specific Gravity, Urine 1.015 1.005 - 1.030   pH 6.0 5.0 - 8.0   Glucose, UA NEGATIVE NEGATIVE mg/dL   Hgb urine dipstick NEGATIVE NEGATIVE   Bilirubin Urine NEGATIVE NEGATIVE   Ketones, ur NEGATIVE NEGATIVE mg/dL   Protein, ur >=604>=300 (A) NEGATIVE mg/dL   Nitrite NEGATIVE NEGATIVE   Leukocytes,Ua MODERATE (A) NEGATIVE   RBC / HPF 6-10 0 - 5 RBC/hpf   WBC, UA 21-50 0 - 5 WBC/hpf   Bacteria, UA MANY (A) NONE SEEN   Squamous Epithelial / LPF 21-50 0 - 5    Comment: Performed at [facility], [address] [place], [place] [address]  CBC     Status: Abnormal   Collection Time: 06/26/20  4:33 AM  Result Value Ref Range   WBC 5.7 4.0 - 10.5 K/uL   RBC 3.81 (L) 3.87 - 5.11 MIL/uL   Hemoglobin 11.0 (L) 12.0 - 15.0 g/dL   HCT 11.936.2 36 - 46 %   MCV 95.0 80.0 - 100.0 fL   MCH 28.9 26.0 - 34.0 pg   MCHC 30.4 30.0 - 36.0 g/dL   RDW 14.714.7 82.911.5 - 56.215.5 %   Platelets 167 150 - 400 K/uL   nRBC 0.0 0.0 - 0.2 %    Comment: Performed at [facility], [address] [place], [place] [address]  Comprehensive metabolic panel     Status: Abnormal   Collection Time: 06/26/20  4:33 AM  Result Value Ref Range   Sodium 142 135 - 145 mmol/L   Potassium 4.0 3.5 - 5.1 mmol/L    Comment: DELTA CHECK NOTED   Chloride 101 98 - 111 mmol/L   CO2 31 22 - 32 mmol/L   Glucose, Bld 134 (H) 70 - 99 mg/dL    Comment: Glucose reference range applies only to samples taken after fasting for at least 8 hours.   BUN 24 (H) 8 - 23 mg/dL   Creatinine, Ser 5.780.67 0.44 - 1.00 mg/dL   Calcium 9.7 8.9 - 46.910.3 mg/dL   Total Protein 6.6 6.5 - 8.1 g/dL   Albumin 3.3 (L) 3.5 - 5.0 g/dL   AST 28 15 - 41 U/L   ALT 16 0 - 44  U/L   Alkaline Phosphatase 36 (L) 38 - 126 U/L   Total Bilirubin 0.8 0.3 - 1.2 mg/dL   GFR calc non Af Amer >60 >60 mL/min   GFR calc Af Amer >60 >60 mL/min   Anion gap 10 5 - 15    Comment: Performed at [facility], [address] [place], [place] [address]   DG Chest Port 1 View  Result Date: 06/25/2020 CLINICAL DATA:  Patient with decreased oxygen saturation.  Cough. EXAM: PORTABLE CHEST 1 VIEW COMPARISON:  Chest radiograph 07/31/2013. FINDINGS: Monitoring leads overlie the patient. Stable cardiomegaly. Patchy consolidative opacity within the left lower lobe. No pleural effusion or pneumothorax. Incompletely visualized proximal right humerus fracture. Thoracic spine degenerative changes. IMPRESSION: 1. Patchy consolidation left lower lobe may represent atelectasis or infection. Followup PA  and lateral chest X-ray is recommended in 3-4 weeks following trial of antibiotic therapy to ensure resolution and exclude underlying malignancy. 2. Redemonstrated proximal right humerus fracture. Electronically Signed   By: Annia Belt M.D.   On: 06/25/2020 12:48    Pending Labs Unresulted Labs (From admission, onward)          Start     Ordered   07/02/20 0500  Creatinine, serum  (enoxaparin (LOVENOX)    CrCl >/= 30 ml/min)  Weekly,   R     Comments: while on enoxaparin therapy    06/25/20 1903   06/25/20 1904  Expectorated sputum assessment w rflx to resp cult  Once,   STAT        06/25/20 1903   06/25/20 1904  Strep pneumoniae urinary antigen  Once,   STAT        06/25/20 1903          Vitals/Pain Today's Vitals   06/26/20 0300 06/26/20 0500 06/26/20 0754 06/26/20 0821  BP: 136/69 (!) 161/62 (!) 181/84   Pulse: 68  74   Resp: 20 20    Temp:   97.9 F (36.6 C)   TempSrc:      SpO2: 93% 92% 92% 92%  Weight:      Height:      PainSc:        Isolation Precautions No active isolations  Medications Medications  azithromycin (ZITHROMAX) 500 mg in sodium chloride 0.9 % 250 mL IVPB  ( Intravenous Stopped 06/25/20 1824)  0.9 %  sodium chloride infusion (0 mLs Intravenous Stopped 06/25/20 1849)  acetaminophen (TYLENOL) tablet 500 mg (has no administration in time range)  fenofibrate tablet 160 mg (has no administration in time range)  lisinopril (ZESTRIL) tablet 40 mg (40 mg Oral Given 06/25/20 2021)  propranolol (INDERAL) tablet 10 mg (10 mg Oral Given 06/25/20 2019)  levothyroxine (SYNTHROID) tablet 75 mcg (75 mcg Oral Given 06/26/20 0743)  montelukast (SINGULAIR) tablet 10 mg (10 mg Oral Given 06/25/20 2019)  enoxaparin (LOVENOX) injection 40 mg (40 mg Subcutaneous Given 06/25/20 2018)  sodium chloride flush (NS) 0.9 % injection 3 mL (0 mLs Intravenous Hold 06/25/20 2148)  sodium chloride flush (NS) 0.9 % injection 3 mL (has no administration in time range)  0.9 %  sodium chloride infusion (has no administration in time range)  ondansetron (ZOFRAN) tablet 4 mg (has no administration in time range)    Or  ondansetron (ZOFRAN) injection 4 mg (has no administration in time range)  guaiFENesin (MUCINEX) 12 hr tablet 600 mg (600 mg Oral Given 06/25/20 2019)  methylPREDNISolone sodium succinate (SOLU-MEDROL) 40 mg/mL injection 40 mg (40 mg Intravenous Given 06/26/20 0745)  cefTRIAXone (ROCEPHIN) 1 g in sodium chloride 0.9 % 100 mL IVPB (has no administration in time range)  ipratropium-albuterol (DUONEB) 0.5-2.5 (3) MG/3ML nebulizer solution 3 mL (3 mLs Nebulization Given 06/26/20 0816)  cefTRIAXone (ROCEPHIN) 1 g in sodium chloride 0.9 % 100 mL IVPB ( Intravenous Stopped 06/25/20 1625)  potassium chloride SA (KLOR-CON) CR tablet 40 mEq (40 mEq Oral Given 06/25/20 1558)    Mobility walks with device High fall risk     R Recommendations: See Admitting Provider Note  Report given to:   Additional Notes:

## 2020-06-26 NOTE — Progress Notes (Signed)
Daughter reports patient some times gets choked while eating a swallow study may be needed.

## 2020-06-26 NOTE — Progress Notes (Signed)
  Echocardiogram 2D Echocardiogram has been performed.  Leta Jungling M 06/26/2020, 1:14 PM

## 2020-06-26 NOTE — Progress Notes (Signed)
PROGRESS NOTE    Lauren BLASCHKE  QIO:[id number] DOB: 07-Nov-1930 DOA: 06/25/2020 PCP: Benita Stabile, MD   Brief Narrative:  Patient is 86 year old female with history of COPD, hyperlipidemia, hypertension, hypothyroidism who presented with complaint of shortness of breath.  She was taken to the urgent care where she was found to have saturation of 83% on room air  was sent to the emergency department.  She was complaining of dyspnea on exertion for last 3 to 4 months.  Also was coughing.  We will do screening test was negative, patient is unvaccinated.  Chest x-ray presentation showed left lower lobe consolidation versus atelectasis.  She was admitted for the management of community-acquired pneumonia, COPD exacerbation.  She was found also to have significant bilateral lower extremity edema and was started on Lasix.   Assessment & Plan:   Active Problems:   CAP (community acquired pneumonia)   Community acquired pneumonia: Presented with shortness of breath, cough.  Chest x-ray showed patchy consolidation in the left lower lobe: Atelectasis or infection.  Patient was started on ceftriaxone and azithromycin.  Sputum culture ordered.  Follow-up blood cultures.  Urine streptococcal antigen negative.  Continue current antibiotics  COPD exacerbation: Presented with bilateral rhonchi no wheezes.  She was hypoxic on room air.  Does not use oxygen at home.  Started on Solu-Medrol, bronchodilators, Mucinex.  Steroids changed to oral.Doesnot take  inhalers at home  Confusion: Confused during my evaluation this morning.  As per the daughter, she is alert and oriented at baseline.  We will check urine culture.  As per the daughter, she gets confusion with antibiotics.  We will continue to monitor mental status.  No history of dementia.  Delirium precautions.  Hypokalemia: Being supplemented and monitor  Hypothyroidism: Continue Synthyroid  Hyperlipidemia: Continue fenofibrate  Hypertension:  Severely hypertensive on presentation.  On lisinopril and Inderal at home.  Will resume these medications.  Continue as needed medications for severe hypertension  Bilateral lower extremity edema: Likely chronic venous insufficiency.  Elevated BNP at 325.  Echocardiogram has been ordered to rule out CHF.  She has 2-3 + lower extremity pitting edema.  Started on Lasix IV.  Will put on oral Lasix on discharge.  Debility/deconditioning: PT/OT evaluation requested         DVT prophylaxis:Lovenox Code Status: Full Family Communication: called and discussed with daughter on 06/26/20 Status is: Observation  The patient remains OBS appropriate and will d/c before 2 midnights.  Dispo: The patient is from: Home              Anticipated d/c is to: Home              Anticipated d/c date is: 1 day              Patient currently is not medically stable to d/c.     Consultants: None  Procedures:None  Antimicrobials:  Anti-infectives (From admission, onward)   Start     Dose/Rate Route Frequency Ordered Stop   06/26/20 1100  cefTRIAXone (ROCEPHIN) 1 g in sodium chloride 0.9 % 100 mL IVPB        1 g 200 mL/hr over 30 Minutes Intravenous Every 24 hours 06/25/20 1903     06/25/20 1515  cefTRIAXone (ROCEPHIN) 1 g in sodium chloride 0.9 % 100 mL IVPB        1 g 200 mL/hr over 30 Minutes Intravenous  Once 06/25/20 1500 06/25/20 1625   06/25/20 1515  azithromycin (ZITHROMAX) 500  mg in sodium chloride 0.9 % 250 mL IVPB        500 mg 250 mL/hr over 60 Minutes Intravenous Every 24 hours 06/25/20 1500        Subjective:  Patient seen and examined at the bedside this morning.  She was hemodynamically stable during my evaluation.  She was confused which could be her baseline.  She was not any kind of distress.  Significant edema noted in the lower extremities.  Objective: Vitals:   06/26/20 0500 06/26/20 0754 06/26/20 0821 06/26/20 0902  BP: (!) 161/62 (!) 181/84  (!) 186/75  Pulse:  74      Resp: 20     Temp:  97.9 F (36.6 C)    TempSrc:      SpO2: 92% 92% 92%   Weight:      Height:        Intake/Output Summary (Last 24 hours) at 06/26/2020 8502 Last data filed at 06/25/2020 1849 Gross per 24 hour  Intake 425.39 ml  Output --  Net 425.39 ml   Filed Weights   06/25/20 1118  Weight: 70.3 kg    Examination:  General exam: Deconditioned, debilitated elderly female HEENT:PERRL,Oral mucosa moist, Ear/Nose normal on gross exam Respiratory system: Bilateral mild rhonchi, no wheezes or crackles  cardiovascular system: S1 & S2 heard, RRR. No JVD, murmurs, rubs, gallops or clicks. No pedal edema. Gastrointestinal system: Abdomen is nondistended, soft and nontender. No organomegaly or masses felt. Normal bowel sounds heard. Central nervous system: Alert and awake but not oriented. No focal neurological deficits. Extremities: 2-3+ bilateral lower extremity pitting edema, no clubbing ,no cyanosis Skin: No rashes, lesions or ulcers,no icterus ,no pallor  Data Reviewed: I have personally reviewed following labs and imaging studies  CBC: Recent Labs  Lab 06/25/20 1203 06/26/20 0433  WBC 8.3 5.7  NEUTROABS 5.8  --   HGB 11.8* 11.0*  HCT 38.5 36.2  MCV 93.0 95.0  PLT 171 167   Basic Metabolic Panel: Recent Labs  Lab 06/25/20 1203 06/26/20 0433  NA 139 142  K 3.2* 4.0  CL 98 101  CO2 31 31  GLUCOSE 112* 134*  BUN 23 24*  CREATININE 0.68 0.67  CALCIUM 10.0 9.7   GFR: Estimated Creatinine Clearance: 49.6 mL/min (by C-G formula based on SCr of 0.67 mg/dL). Liver Function Tests: Recent Labs  Lab 06/25/20 1203 06/26/20 0433  AST 32 28  ALT 18 16  ALKPHOS 42 36*  BILITOT 0.9 0.8  PROT 7.1 6.6  ALBUMIN 3.8 3.3*   No results for input(s): LIPASE, AMYLASE in the last 168 hours. No results for input(s): AMMONIA in the last 168 hours. Coagulation Profile: No results for input(s): INR, PROTIME in the last 168 hours. Cardiac Enzymes: No results for  input(s): CKTOTAL, CKMB, CKMBINDEX, TROPONINI in the last 168 hours. BNP (last 3 results) No results for input(s): PROBNP in the last 8760 hours. HbA1C: No results for input(s): HGBA1C in the last 72 hours. CBG: No results for input(s): GLUCAP in the last 168 hours. Lipid Profile: No results for input(s): CHOL, HDL, LDLCALC, TRIG, CHOLHDL, LDLDIRECT in the last 72 hours. Thyroid Function Tests: No results for input(s): TSH, T4TOTAL, FREET4, T3FREE, THYROIDAB in the last 72 hours. Anemia Panel: No results for input(s): VITAMINB12, FOLATE, FERRITIN, TIBC, IRON, RETICCTPCT in the last 72 hours. Sepsis Labs: Recent Labs  Lab 06/25/20 1203 06/25/20 1445  LATICACIDVEN 0.9 0.9    Recent Results (from the past 240 hour(s))  Respiratory Panel  by RT PCR (Flu A&B, Covid) - Nasopharyngeal Swab     Status: None   Collection Time: 06/25/20 11:31 AM   Specimen: Nasopharyngeal Swab  Result Value Ref Range Status   SARS Coronavirus 2 by RT PCR NEGATIVE NEGATIVE Final    Comment: (NOTE) SARS-CoV-2 target nucleic acids are NOT DETECTED.  The SARS-CoV-2 RNA is generally detectable in upper respiratoy specimens during the acute phase of infection. The lowest concentration of SARS-CoV-2 viral copies this assay can detect is 131 copies/mL. A negative result does not preclude SARS-Cov-2 infection and should not be used as the sole basis for treatment or other patient management decisions. A negative result may occur with  improper specimen collection/handling, submission of specimen other than nasopharyngeal swab, presence of viral mutation(s) within the areas targeted by this assay, and inadequate number of viral copies (<131 copies/mL). A negative result must be combined with clinical observations, patient history, and epidemiological information. The expected result is Negative.  Fact Sheet for Patients:  https://www.moore.com/  Fact Sheet for Healthcare Providers:   https://www.young.biz/  This test is no t yet approved or cleared by the [place] FDA and  has been authorized for detection and/or diagnosis of SARS-CoV-2 by FDA under an Emergency Use Authorization (EUA). This EUA will remain  in effect (meaning this test can be used) for the duration of the COVID-19 declaration under Section 564(b)(1) of the Act, 21 U.S.C. section 360bbb-3(b)(1), unless the authorization is terminated or revoked sooner.     Influenza A by PCR NEGATIVE NEGATIVE Final   Influenza B by PCR NEGATIVE NEGATIVE Final    Comment: (NOTE) The Xpert Xpress SARS-CoV-2/FLU/RSV assay is intended as an aid in  the diagnosis of influenza from Nasopharyngeal swab specimens and  should not be used as a sole basis for treatment. Nasal washings and  aspirates are unacceptable for Xpert Xpress SARS-CoV-2/FLU/RSV  testing.  Fact Sheet for Patients: https://www.moore.com/  Fact Sheet for Healthcare Providers: https://www.young.biz/  This test is not yet approved or cleared by the [place] FDA and  has been authorized for detection and/or diagnosis of SARS-CoV-2 by  FDA under an Emergency Use Authorization (EUA). This EUA will remain  in effect (meaning this test can be used) for the duration of the  Covid-19 declaration under Section 564(b)(1) of the Act, 21  U.S.C. section 360bbb-3(b)(1), unless the authorization is  terminated or revoked. Performed at [facility], [address] [place], [place] [address]   Culture, blood (routine x 2)     Status: None (Preliminary result)   Collection Time: 06/25/20 11:36 AM   Specimen: Vein; Blood  Result Value Ref Range Status   Specimen Description   Final    LEFT ANTECUBITAL BOTTLES DRAWN AEROBIC AND ANAEROBIC   Special Requests Blood Culture adequate volume  Final   Culture   Final    NO GROWTH <12 HOURS Performed at [facility], [address], [place], [place]  [address]    Report Status PENDING  Incomplete  Culture, blood (routine x 2)     Status: None (Preliminary result)   Collection Time: 06/25/20 12:18 PM   Specimen: Vein; Blood  Result Value Ref Range Status   Specimen Description BLOOD LEFT ARM BOTTLES DRAWN AEROBIC AND ANAEROBIC  Final   Special Requests Blood Culture adequate volume  Final   Culture   Final    NO GROWTH < 12 HOURS Performed at [facility], [address] [place], [place] [address]    Report  Status PENDING  Incomplete         Radiology Studies: DG Chest Port 1 View  Result Date: 06/25/2020 CLINICAL DATA:  Patient with decreased oxygen saturation.  Cough. EXAM: PORTABLE CHEST 1 VIEW COMPARISON:  Chest radiograph 07/31/2013. FINDINGS: Monitoring leads overlie the patient. Stable cardiomegaly. Patchy consolidative opacity within the left lower lobe. No pleural effusion or pneumothorax. Incompletely visualized proximal right humerus fracture. Thoracic spine degenerative changes. IMPRESSION: 1. Patchy consolidation left lower lobe may represent atelectasis or infection. Followup PA and lateral chest X-ray is recommended in 3-4 weeks following trial of antibiotic therapy to ensure resolution and exclude underlying malignancy. 2. Redemonstrated proximal right humerus fracture. Electronically Signed   By: Annia Beltrew  Davis M.D.   On: 06/25/2020 12:48        Scheduled Meds: . enoxaparin (LOVENOX) injection  40 mg Subcutaneous Q24H  . fenofibrate  160 mg Oral Daily  . guaiFENesin  600 mg Oral BID  . ipratropium-albuterol  3 mL Nebulization Q6H  . levothyroxine  75 mcg Oral q morning - 10a  . lisinopril  40 mg Oral Daily  . methylPREDNISolone (SOLU-MEDROL) injection  40 mg Intravenous Q6H  . montelukast  10 mg Oral Daily  . propranolol  10 mg Oral BID  . sodium chloride flush  3 mL Intravenous Q12H   Continuous Infusions: . sodium chloride Stopped (06/25/20 1849)  . sodium chloride    . azithromycin Stopped (06/25/20 1824)    . cefTRIAXone (ROCEPHIN)  IV       LOS: 0 days    Time spent:35 mins. More than 50% of that time was spent in counseling and/or coordination of care.      Burnadette PopAmrit Jerrol Helmers, MD [facility] P9/27/2021, 9:04 AM

## 2020-06-26 NOTE — TOC Initial Note (Signed)
Transition of Care [facility]) - Initial/Assessment Note    Patient Details  Name: [name] MRN: [id number] Date of Birth: Nov 19, 1930  [facility]) CM/SW Contact:    Leitha Bleak, RN Phone Number: 06/26/2020, 2:41 PM  Clinical Narrative:         Patient admitted with Pneumonia, Patient lives alone, with daughter living next door. TOC consulted for discharge plan. TOC spoke with Rolly Salter - grand daughter. Daughter is currently driving patient to Out Patient PT. This has become more difficult to make these appointments. Patient is weak and confused today.  [facility] requesting [facility]. [facility] with [facility] accepted the referral for HHPT.  Family also requesting a rolling walker. [facility] with [facility] accepted the referral and will deliver.           Expected Discharge Plan: Home w Home Health Services Barriers to Discharge: Continued Medical Work up   Patient Goals and CMS Choice Patient states their goals for this hospitalization and ongoing recovery are:: to go home. CMS Medicare.gov Compare Post Acute Care list provided to:: Other (Comment Required) (Grand-Daughter)    Expected Discharge Plan and Services Expected Discharge Plan: Home w Home Health Services   Discharge Planning Services: CM Consult   Living arrangements for the past 2 months: Single Family Home                 DME Arranged: Walker rolling DME Agency: [facility] Date DME Agency Contacted: 06/26/20 Time DME Agency Contacted: 1100 Representative spoke with at DME Agency: Thereasa Distance HH Arranged: PT HH Agency: [facility] Date HH Agency Contacted: 06/26/20 Time HH Agency Contacted: 1330 Representative spoke with at [facility] Agency: Melissa  Prior Living Arrangements/Services Living arrangements for the past 2 months: Single Family Home Lives with:: Self   Do you feel safe going back to the place where you live?: Yes      Need for Family Participation in Patient Care: Yes  (Comment) Care giver support system in place?: Yes (comment) Current home services: Other (comment) (OPPT) Criminal Activity/Legal Involvement Pertinent to Current Situation/Hospitalization: No - Comment as needed  Activities of Daily Living Home Assistive Devices/Equipment: Walker (specify type), Eyeglasses ADL Screening (condition at time of admission) Patient's cognitive ability adequate to safely complete daily activities?: No Is the patient deaf or have difficulty hearing?: Yes Does the patient have difficulty seeing, even when wearing glasses/contacts?: No Does the patient have difficulty concentrating, remembering, or making decisions?: Yes Patient able to express need for assistance with ADLs?: Yes Does the patient have difficulty dressing or bathing?: Yes Independently performs ADLs?: No Communication: Independent Dressing (OT): Needs assistance Is this a change from baseline?: Pre-admission baseline Grooming: Needs assistance Is this a change from baseline?: Pre-admission baseline Feeding: Needs assistance Is this a change from baseline?: Pre-admission baseline Bathing: Needs assistance Is this a change from baseline?: Pre-admission baseline Toileting: Needs assistance Is this a change from baseline?: Pre-admission baseline In/Out Bed: Needs assistance Is this a change from baseline?: Pre-admission baseline Walks in Home: Needs assistance Is this a change from baseline?: Pre-admission baseline Does the patient have difficulty walking or climbing stairs?: Yes Weakness of Legs: Both Weakness of Arms/Hands: None  Permission Sought/Granted      Share Information with NAME: Rolly Salter     Permission granted to share info w Relationship: Grand- daughter     Emotional Assessment       Orientation: : Oriented to Self Alcohol / Substance Use: Not Applicable Psych Involvement: No (comment)  Admission diagnosis:  CAP (community acquired pneumonia) [J18.9] Acute  respiratory failure with hypoxia (HCC) [J96.01] Community acquired pneumonia of left lower lobe of lung [J18.9] Patient Active Problem List   Diagnosis Date Noted  . CAP (community acquired pneumonia) 06/25/2020  . Lichen sclerosus et atrophicus 06/30/2019  . Pneumococcal pneumonia (HCC) 10/28/2012  . Wheezing 10/28/2012  . Hypothyroidism 10/28/2012  . HTN (hypertension), benign 10/28/2012  . Hypercholesteremia 10/28/2012  . Dehydration 10/28/2012   PCP:  Benita Stabile, MD Pharmacy:   [facility] [place] [place] - [address] [place] [address] Phone: [phone] Fax: [phone]

## 2020-06-27 ENCOUNTER — Ambulatory Visit (HOSPITAL_COMMUNITY): Payer: PPO

## 2020-06-27 ENCOUNTER — Other Ambulatory Visit: Payer: Self-pay

## 2020-06-27 ENCOUNTER — Inpatient Hospital Stay (HOSPITAL_COMMUNITY): Payer: PPO

## 2020-06-27 LAB — COMPREHENSIVE METABOLIC PANEL
ALT: <5 U/L (ref 0–44)
AST: 40 U/L (ref 15–41)
Albumin: 3.5 g/dL (ref 3.5–5.0)
Alkaline Phosphatase: 43 U/L (ref 38–126)
Anion gap: 20 — ABNORMAL HIGH (ref 5–15)
BUN: 32 mg/dL — ABNORMAL HIGH (ref 8–23)
CO2: 27 mmol/L (ref 22–32)
Calcium: 10 mg/dL (ref 8.9–10.3)
Chloride: 91 mmol/L — ABNORMAL LOW (ref 98–111)
Creatinine, Ser: 1.03 mg/dL — ABNORMAL HIGH (ref 0.44–1.00)
GFR calc Af Amer: 57 mL/min — ABNORMAL LOW (ref >60)
GFR calc non Af Amer: 49 mL/min — ABNORMAL LOW (ref >60)
Glucose, Bld: 249 mg/dL — ABNORMAL HIGH (ref 70–99)
Potassium: 3 mmol/L — ABNORMAL LOW (ref 3.5–5.1)
Sodium: 138 mmol/L (ref 135–145)
Total Bilirubin: 0.7 mg/dL (ref 0.3–1.2)
Total Protein: 7.2 g/dL (ref 6.5–8.1)

## 2020-06-27 LAB — CBC
HCT: 43.5 % (ref 36.0–46.0)
Hemoglobin: 13.4 g/dL (ref 12.0–15.0)
MCH: 28.4 pg (ref 26.0–34.0)
MCHC: 30.8 g/dL (ref 30.0–36.0)
MCV: 92.2 fL (ref 80.0–100.0)
Platelets: 295 10*3/uL (ref 150–400)
RBC: 4.72 MIL/uL (ref 3.87–5.11)
RDW: 14.4 % (ref 11.5–15.5)
WBC: 15.3 10*3/uL — ABNORMAL HIGH (ref 4.0–10.5)
nRBC: 0 % (ref 0.0–0.2)

## 2020-06-27 LAB — TROPONIN I (HIGH SENSITIVITY): Troponin I (High Sensitivity): 50 ng/L — ABNORMAL HIGH (ref <18)

## 2020-06-27 LAB — GLUCOSE, CAPILLARY: Glucose-Capillary: 230 mg/dL — ABNORMAL HIGH (ref 70–99)

## 2020-06-27 MED ORDER — LEVETIRACETAM IN NACL 500 MG/100ML IV SOLN
500.0000 mg | Freq: Two times a day (BID) | INTRAVENOUS | Status: DC
  Administered 2020-06-27 – 2020-06-28 (×2): 500 mg via INTRAVENOUS
  Filled 2020-06-27 (×2): qty 100

## 2020-06-27 MED ORDER — IPRATROPIUM-ALBUTEROL 0.5-2.5 (3) MG/3ML IN SOLN: 3.0000 mL | Freq: Four times a day (QID) | RESPIRATORY_TRACT | 0 refills | Status: AC | PRN

## 2020-06-27 MED ORDER — POLYVINYL ALCOHOL 1.4 % OP SOLN: 1.0000 [drp] | Freq: Four times a day (QID) | OPHTHALMIC | Status: DC | PRN

## 2020-06-27 MED ORDER — ONDANSETRON HCL 4 MG/2ML IJ SOLN: 4.0000 mg | Freq: Four times a day (QID) | INTRAMUSCULAR | Status: DC | PRN

## 2020-06-27 MED ORDER — ACETAMINOPHEN 325 MG PO TABS: 650.0000 mg | ORAL_TABLET | Freq: Four times a day (QID) | ORAL | Status: DC | PRN

## 2020-06-27 MED ORDER — ATROPINE SULFATE 1 % OP SOLN: 4.0000 [drp] | OPHTHALMIC | Status: DC | PRN

## 2020-06-27 MED ORDER — POTASSIUM CHLORIDE CRYS ER 20 MEQ PO TBCR: 20.0000 meq | EXTENDED_RELEASE_TABLET | Freq: Every day | ORAL | Status: DC

## 2020-06-27 MED ORDER — MORPHINE SULFATE (PF) 2 MG/ML IV SOLN: 2.0000 mg | INTRAVENOUS | Status: DC | PRN

## 2020-06-27 MED ORDER — ACETAMINOPHEN 650 MG RE SUPP: 650.0000 mg | Freq: Four times a day (QID) | RECTAL | Status: DC | PRN

## 2020-06-27 MED ORDER — LORAZEPAM 2 MG/ML IJ SOLN: 1.0000 mg | INTRAMUSCULAR | Status: DC | PRN

## 2020-06-27 MED ORDER — LORAZEPAM 2 MG/ML IJ SOLN: 1.0000 mg | Freq: Four times a day (QID) | INTRAMUSCULAR | Status: DC | PRN

## 2020-06-27 MED ORDER — FUROSEMIDE 40 MG PO TABS: 40.0000 mg | ORAL_TABLET | Freq: Every day | ORAL | Status: DC

## 2020-06-27 MED ORDER — BISACODYL 10 MG RE SUPP: 10.0000 mg | Freq: Every day | RECTAL | Status: DC | PRN

## 2020-06-27 MED ORDER — BIOTENE DRY MOUTH MT LIQD: 15.0000 mL | OROMUCOSAL | Status: DC | PRN

## 2020-06-27 MED ORDER — LORAZEPAM 2 MG/ML IJ SOLN
1.0000 mg | INTRAMUSCULAR | Status: DC | PRN
  Administered 2020-06-27: 1 mg via INTRAVENOUS
  Filled 2020-06-27: qty 1

## 2020-06-27 MED ORDER — AMLODIPINE BESYLATE 5 MG PO TABS
10.0000 mg | ORAL_TABLET | Freq: Every day | ORAL | Status: DC
  Administered 2020-06-27: 10 mg via ORAL
  Filled 2020-06-27: qty 2

## 2020-06-27 MED ORDER — ONDANSETRON 4 MG PO TBDP: 4.0000 mg | ORAL_TABLET | Freq: Four times a day (QID) | ORAL | Status: DC | PRN

## 2020-06-27 NOTE — Care Management Important Message (Signed)
Important Message  Patient Details  Name: [name] MRN: [id number] Date of Birth: August 13, 1931   Medicare Important Message Given:  Yes     Corey Harold 06/27/2020, 2:05 PM

## 2020-06-27 NOTE — Progress Notes (Signed)
SLP Cancellation Note  Patient Details Name: [name] MRN: [id number] DOB: [birth year]   Cancelled treatment:       Reason Eval/Treat Not Completed: Medical issues which prohibited therapy;Patient not medically ready; Medical team currently in with Pt due to respiratory arrest.  Thank you,  Havery Moros, CCC-SLP [phone]    [name] 06/27/2020, 6:55 PM

## 2020-06-27 NOTE — Plan of Care (Signed)
°  Problem: Acute Rehab PT Goals(only PT should resolve) Goal: Pt Will Go Supine/Side To Sit Outcome: Progressing Flowsheets (Taken 06/27/2020 1253) Pt will go Supine/Side to Sit: with modified independence Goal: Pt Will Go Sit To Supine/Side Outcome: Progressing Flowsheets (Taken 06/27/2020 1253) Pt will go Sit to Supine/Side: with modified independence Goal: Patient Will Transfer Sit To/From Stand Outcome: Progressing Flowsheets (Taken 06/27/2020 1253) Patient will transfer sit to/from stand: with min guard assist Goal: Pt Will Transfer Bed To Chair/Chair To Bed Outcome: Progressing Flowsheets (Taken 06/27/2020 1253) Pt will Transfer Bed to Chair/Chair to Bed: min guard assist Goal: Pt Will Ambulate Outcome: Progressing Flowsheets (Taken 06/27/2020 1253) Pt will Ambulate:  100 feet  with min guard assist  with least restrictive assistive device   Tori Leman Martinek PT, DPT 06/27/20, 12:53 PM [phone]

## 2020-06-27 NOTE — Plan of Care (Signed)
  Problem: Acute Rehab OT Goals (only OT should resolve) Goal: Pt. Will Perform Eating Flowsheets (Taken 06/27/2020 0958) Pt Will Perform Eating:  with modified independence  sitting Goal: Pt. Will Perform Grooming Flowsheets (Taken 06/27/2020 0958) Pt Will Perform Grooming:  with supervision  sitting  standing Goal: Pt. Will Perform Upper Body Dressing Flowsheets (Taken 06/27/2020 0958) Pt Will Perform Upper Body Dressing:  with modified independence  sitting Goal: Pt. Will Transfer To Toilet Flowsheets (Taken 06/27/2020 [phone]) Pt Will Transfer to Toilet:  with min guard assist  ambulating  regular height toilet  bedside commode Goal: Pt. Will Perform Toileting-Clothing Manipulation Flowsheets (Taken 06/27/2020 0958) Pt Will Perform Toileting - Clothing Manipulation and hygiene:  with supervision  sitting/lateral leans  sit to/from stand Goal: Pt/Caregiver Will Perform Home Exercise Program Flowsheets (Taken 06/27/2020 [phone]) Pt/caregiver will Perform Home Exercise Program:  Increased ROM  Increased strength  Right Upper extremity  Independently  With written HEP provided

## 2020-06-27 NOTE — Progress Notes (Signed)
  I responded to Rapid Response--- in room 68  86 year old female with history of COPD, hyperlipidemia, hypertension, hypothyroidism in the hospital being treated for presumed pneumonia, COPD exacerbation and diastolic dysfunction with generalized weakness and deconditioning-who  became unresponsive and hypoxic within a few minutes of her daughter leaving her bedside -Upon arrival patient is unresponsive even to noxious stimuli -respirations are shallow and irregular, she is hypoxic with O2 sats in the low 80s requiring nonrebreather bag -EKG without acute findings -EDP Dr. Deretha Emory at bedside -After conversations with patient's daughter Ms Elwin Mocha and granddaughter Rolly Salter--- family decided against intubation for airway protection not to allow for CT head  -Patient's granddaughter Rolly Salter is an Charity fundraiser -Family request comfort care however they would like to give a trial of Keppra and lorazepam just in case patient has seizures  -Patient is a DNR/DNI with full comfort care --- Per family request  --Total care time is over 47 minutes  Shon Hale, MD

## 2020-06-27 NOTE — Evaluation (Signed)
Occupational Therapy Evaluation Patient Details Name: [name] MRN: [id number] DOB: [birth year] Today's Date: 06/27/2020    History of Present Illness [name]  is a 86 y.o. female, with history of COPD, hyperlipidemia, hypertension, hypothyroidism came to hospital with worsening shortness of breath for past few days.  Patient has history of COPD but is not on home oxygen.  Patient's daughter took her to urgent care she was found to have O2 sats of 82% on room air.  She was sent to the ED.  Patient is a poor historian.  She is complaining of shortness of breath for past 3 to 4 months.  Also has been coughing up phlegm.   Clinical Impression   Pt seen as co-evaluation with PT this am, daughter present. Pt minimally verbal this am, when conversing pt with confusion and speaking of past family members/events. OT is familiar with pt from outpatient care-at baseline pt ambulates without DME and is independent in basic ADLs. During session pt requiring increased time and cuing for following directions, as well as physical assistance for ADL completion. Discussed with daughter who agrees that cognition is playing a large role in pt's current functioning. Recommend HHOT services to increase safety and independence in ADL completion. Will continue to follow while in acute care.     Follow Up Recommendations  Home health OT;Supervision/Assistance - 24 hour    Equipment Recommendations  3 in 1 bedside commode;Tub/shower seat       Precautions / Restrictions Precautions Precautions: Fall Restrictions Weight Bearing Restrictions: Yes RUE Weight Bearing: Non weight bearing      Mobility Bed Mobility               General bed mobility comments: Defer to PT note  Transfers                 General transfer comment: Defer to PT note        ADL either performed or assessed with clinical judgement   ADL Overall ADL's : Needs assistance/impaired                      Lower Body Dressing: Total assistance;Bed level   Toilet Transfer: Moderate assistance;Cueing for safety;Cueing for sequencing;Stand-pivot;BSC;RW Toilet Transfer Details (indicate cue type and reason): Increased time required for following directions and completing toilet transfer. Initially trialed RW, pt did better when RW was removed and she was able to pivot to [facility] Toileting- Clothing Manipulation and Hygiene: Min guard;Sitting/lateral lean Toileting - Clothing Manipulation Details (indicate cue type and reason): pt with confusion, difficulty performing pericare due to gown/heart monitor/etc distracting pt       General ADL Comments: pt's daughter reporting pt requiring increased assistance from her baseline. Family will be staying with her 24/7 for the near future     Vision Baseline Vision/History: No visual deficits Patient Visual Report: No change from baseline Vision Assessment?: No apparent visual deficits            Pertinent Vitals/Pain Pain Assessment: No/denies pain     Hand Dominance Right   Extremity/Trunk Assessment Upper Extremity Assessment Upper Extremity Assessment: RUE deficits/detail RUE Deficits / Details: Pt with healing right proximal humerus fracture RUE Sensation: WNL RUE Coordination: WNL   Lower Extremity Assessment Lower Extremity Assessment: Defer to PT evaluation       Communication Communication Communication: Receptive difficulties;Expressive difficulties   Cognition Arousal/Alertness: Awake/alert Behavior During Therapy: Flat affect Overall Cognitive Status: Impaired/Different from baseline Area of Impairment:  Orientation;Following commands                 Orientation Level: Disoriented to;Place;Time;Situation     Following Commands: Follows one step commands inconsistently                      Home Living Family/patient expects to be discharged to:: Private residence Living Arrangements:  Children Available Help at Discharge: Family;Available 24 hours/day Type of Home: House Home Access: Level entry     Home Layout: One level               Home Equipment: None          Prior Functioning/Environment Level of Independence: Needs assistance  Gait / Transfers Assistance Needed: Pt ambulates household distances without AD, sleeps in chair due to RUE pain ADL's / Homemaking Assistance Needed: Daughter reports previously independent with ADLs, simple meal preparation. Does not drive            OT Problem List: Decreased strength;Decreased activity tolerance;Impaired balance (sitting and/or standing);Decreased cognition;Decreased safety awareness;Decreased knowledge of use of DME or AE;Cardiopulmonary status limiting activity      OT Treatment/Interventions: Self-care/ADL training;Therapeutic exercise;Energy conservation;DME and/or AE instruction;Therapeutic activities;Patient/family education    OT Goals(Current goals can be found in the care plan section) Acute Rehab OT Goals Patient Stated Goal: None stated-daughter reports to get stronger and need less assistance OT Goal Formulation: With patient/family Time For Goal Achievement: 07/11/20 Potential to Achieve Goals: Good  OT Frequency: Min 2X/week           Co-evaluation PT/OT/SLP Co-Evaluation/Treatment: Yes Reason for Co-Treatment: Complexity of the patient's impairments (multi-system involvement);To address functional/ADL transfers   OT goals addressed during session: ADL's and self-care;Proper use of Adaptive equipment and DME         End of Session Equipment Utilized During Treatment: Gait belt;Rolling walker  Activity Tolerance: Patient tolerated treatment well Patient left: in bed;with call bell/phone within reach;with chair alarm set;with family/visitor present  OT Visit Diagnosis: Muscle weakness (generalized) (M62.81);Other symptoms and signs involving cognitive function                 Time: 3491-7915 OT Time Calculation (min): 31 min Charges:  OT General Charges $OT Visit: 1 Visit OT Evaluation $OT Eval Low Complexity: 1 Low  Ezra Sites, OTR/L  [phone] 06/27/2020, 9:53 AM

## 2020-06-27 NOTE — Evaluation (Signed)
Physical Therapy Evaluation Patient Details Name: [name] MRN: [id number] DOB: 30-Apr-1931 Today's Date: 06/27/2020   History of Present Illness  86 y.o. female, with history of COPD, HTN came to hospital with worsening shortness of breath for past few days. Patient's daughter took her to urgent care she was found to have O2 sats of 82% on room air and was sent to the ED.  She is complaining of shortness of breath for past 3 to 4 months. Also has been coughing up phlegm.  Clinical Impression  Pt admitted with above diagnosis. PTA, pt active with outpatient OT for R humerus fracture and living at home independently with family checking on pt. Currently, pt appears to have difficulty following commands and motor planning requiring increased physical assistance with tasks. Pt requires assist to come to sitting EOB and transfer to [facility], but possibly due to cognition. Daughter reports the pt has experience similar hospital delirium before and is hopeful it will clear. Pt minimally verbal during evaluation, demonstrates mouthing of words towards daugther in corner of room and makes a few comments regarding family that are irrelevant to task at hand. Family's goal is to bring pt home with assistance at first and progress pt back to PLOF. Pt currently with functional limitations due to the deficits listed below (see PT Problem List). Pt will benefit from skilled PT to increase their independence and safety with mobility to allow discharge to the venue listed below.       Follow Up Recommendations Home health PT;Supervision/Assistance - 24 hour    Equipment Recommendations  Rolling walker with 5" wheels;3in1 (PT)    Recommendations for Other Services       Precautions / Restrictions Precautions Precautions: Fall Restrictions Weight Bearing Restrictions: Yes RUE Weight Bearing: Non weight bearing      Mobility  Bed Mobility Overal bed mobility: Needs Assistance Bed Mobility: Supine to  Sit;Sit to Supine  Supine to sit: Mod assist Sit to supine: Supervision   General bed mobility comments: mod A to come to sitting EOB possibly due to inability to cue pt for task, SUPV to return to supine with cues for repositioning in bed  Transfers Overall transfer level: Needs assistance  Transfers: Sit to/from Stand;Stand Pivot Transfers Sit to Stand: Mod assist Stand pivot transfers: Mod assist    General transfer comment: attempted with RW but pt appears confused, mod A to power up from EOB and mod A to pivot over to [facility] with cues for hand placement  Ambulation/Gait  General Gait Details: pt takes limited sidesteps at bedside, unable to cue pt for ambulation with RW, maintains bil knee flexion in static stance  Stairs            Wheelchair Mobility    Modified Rankin (Stroke Patients Only)       Balance Overall balance assessment: Needs assistance;History of Falls Sitting-balance support: Feet supported;Single extremity supported Sitting balance-Leahy Scale: Fair Sitting balance - Comments: seated EOB  Standing balance support: During functional activity;No upper extremity supported Standing balance-Leahy Scale: Fair Standing balance comment: no AD, occasionally reaches for furniture or therapist to steady          Pertinent Vitals/Pain Pain Assessment: Faces Faces Pain Scale: No hurt    Home Living Family/patient expects to be discharged to:: Private residence Living Arrangements: Children Available Help at Discharge: Family;Available 24 hours/day Type of Home: House Home Access: Level entry     Home Layout: One level Home Equipment: None  Prior Function Level of Independence: Needs assistance   Gait / Transfers Assistance Needed: Daughter reports pt ambulates household distances without AD, sleeps in chair due to RUE pain  ADL's / Homemaking Assistance Needed: Daughter reports pt independent with ADLs, sponge baths, simple meal  preparation  Comments: Daughter reports family provides transportation, family checks on pt often but pt is mostly independent around the home     Hand Dominance   Dominant Hand: Right    Extremity/Trunk Assessment   Upper Extremity Assessment Upper Extremity Assessment: Defer to OT evaluation RUE Deficits / Details: Pt with healing right proximal humerus fracture RUE Sensation: WNL RUE Coordination: WNL    Lower Extremity Assessment Lower Extremity Assessment: Generalized weakness;Difficult to assess due to impaired cognition (strength grossly 3+/5, unable to MMT due to cognition)    Cervical / Trunk Assessment Cervical / Trunk Assessment: Normal  Communication   Communication: Receptive difficulties;Expressive difficulties (pt mouthing words to daughter, makes a comment regarding great grandson, and otherwise nonverbal during eval)  Cognition Arousal/Alertness: Awake/alert Behavior During Therapy: Flat affect Overall Cognitive Status: Impaired/Different from baseline Area of Impairment: Orientation;Following commands                 Orientation Level: Disoriented to;Place;Time;Situation     Following Commands: Follows one step commands inconsistently       General Comments: Pt mouthing words at daughter. Daughter reports pt was talking nonstop yesterday and she is wondering if pt is having some deliruim because this has happened in the past.      General Comments      Exercises     Assessment/Plan    PT Assessment Patient needs continued PT services  PT Problem List Decreased strength;Decreased activity tolerance;Decreased balance;Decreased cognition;Decreased knowledge of use of DME;Decreased safety awareness       PT Treatment Interventions DME instruction;Gait training;Functional mobility training;Therapeutic activities;Therapeutic exercise;Balance training;Neuromuscular re-education;Cognitive remediation;Patient/family education    PT Goals  (Current goals can be found in the Care Plan section)  Acute Rehab PT Goals Patient Stated Goal: None stated-daughter reports to get stronger and need less assistance PT Goal Formulation: With family Time For Goal Achievement: 07/11/20 Potential to Achieve Goals: Fair    Frequency Min 3X/week   Barriers to discharge        Co-evaluation PT/OT/SLP Co-Evaluation/Treatment: Yes Reason for Co-Treatment: Complexity of the patient's impairments (multi-system involvement);To address functional/ADL transfers PT goals addressed during session: Mobility/safety with mobility;Balance OT goals addressed during session: ADL's and self-care;Proper use of Adaptive equipment and DME       AM-PAC PT "6 Clicks" Mobility  Outcome Measure Help needed turning from your back to your side while in a flat bed without using bedrails?: A Little Help needed moving from lying on your back to sitting on the side of a flat bed without using bedrails?: A Little Help needed moving to and from a bed to a chair (including a wheelchair)?: A Lot Help needed standing up from a chair using your arms (e.g., wheelchair or bedside chair)?: A Lot Help needed to walk in hospital room?: A Lot Help needed climbing 3-5 steps with a railing? : Total 6 Click Score: 13    End of Session   Activity Tolerance: Patient tolerated treatment well Patient left: in bed;with call bell/phone within reach;with bed alarm set;with family/visitor present Nurse Communication: Mobility status;Other (comment) (restraints removed and daughter in room) PT Visit Diagnosis: Unsteadiness on feet (R26.81);Other abnormalities of gait and mobility (R26.89);Muscle weakness (generalized) (M62.81);History of falling (  Z91.81)    Time: 4098-1191 PT Time Calculation (min) (ACUTE ONLY): 31 min   Charges:   PT Evaluation $PT Eval Low Complexity: 1 Low PT Treatments $Therapeutic Activity: 8-22 mins         Domenick Bookbinder PT, DPT 06/27/20, 12:50  PM [phone]

## 2020-06-27 NOTE — Progress Notes (Signed)
PROGRESS NOTE    [name]  BBC:[id number] DOB: 19-Jun-1931 DOA: 06/25/2020 PCP: Benita Stabile, MD   Brief Narrative:  Patient is 86 year old female with history of COPD, hyperlipidemia, hypertension, hypothyroidism who presented with complaint of shortness of breath.  She was taken to the urgent care where she was found to have saturation of 83% on room air  was sent to the emergency department.  She was complaining of dyspnea on exertion for last 3 to 4 months.  Also was coughing.  We will do screening test was negative, patient is unvaccinated.  Chest x-ray presentation showed left lower lobe consolidation versus atelectasis.  She was also found to have severe bilateral lower extremity edema. she was admitted for the management of community-acquired pneumonia, COPD exacerbation.  She was found also to have significant bilateral lower extremity edema and was started on Lasix.  Her overall condition is improving and we are planning to discharge her tomorrow with home health if remains stable.   Assessment & Plan:   Active Problems:   CAP (community acquired pneumonia)   Community acquired pneumonia: Presented with shortness of breath, cough.  Chest x-ray showed patchy consolidation in the left lower lobe: Atelectasis or infection.  Patient was started on ceftriaxone and azithromycin.  Sputum culture ordered.  Follow-up blood cultures.  Urine streptococcal antigen negative.  Continue current antibiotics ,we will change to oral to oral tomorrow on DC  COPD exacerbation: Presented with bilateral rhonchi no wheezes.  She was hypoxic on room air.  Does not use oxygen at home.  Started on Solu-Medrol, bronchodilators, Mucinex.  Steroids changed to oral.Doesnot take  inhalers at home  Confusion/suspected UTI: Presented with confusion.  As per the daughter, she is alert and oriented at baseline.  UA suggestive of urinary tract infection.  We will check urine culture.We will continue to monitor  mental status.  No history of dementia. Her mental status has improved and as well as improvement in the orientation.  Hypokalemia: Supplemented and corrected  Hypothyroidism: Continue Synthyroid  Hyperlipidemia: Continue fenofibrate  Hypertension: Severely hypertensive on presentation.  On lisinopril and Inderal at home. Added amlodipine.  Continue as needed medications for severe hypertension  Bilateral lower extremity edema: Likely chronic venous insufficiency.  Elevated BNP at 325.  Echocardiogram showed ejection fraction of 55 to 60%, grade 1 diastolic dysfunction..  She had 2-3 + lower extremity pitting edema.  Started on Lasix IV.  There is significant improvement in the lower extremity edema today.  Lasix changed to 40 mg daily.  Suspicion for dysphagia: Daughter reported episodes of choking recently.  We have requested for speech therapy evaluation.  Debility/deconditioning: PT/OT evaluation requested and recommended home health.         DVT prophylaxis:Lovenox Code Status: Full Family Communication: Discussed with daughter at the bedside Status is: Inpatient   Dispo: The patient is from: Home              Anticipated d/c is to: Home               Anticipated d/c date is: 1              Patient currently is not medically stable to d/c.  Daughter does not feel safe to send her home today.  Plan for tomorrow   Consultants: None  Procedures:None  Antimicrobials:  Anti-infectives (From admission, onward)   Start     Dose/Rate Route Frequency Ordered Stop   06/26/20 1100  cefTRIAXone (ROCEPHIN) 1  g in sodium chloride 0.9 % 100 mL IVPB        1 g 200 mL/hr over 30 Minutes Intravenous Every 24 hours 06/25/20 1903     06/25/20 1515  cefTRIAXone (ROCEPHIN) 1 g in sodium chloride 0.9 % 100 mL IVPB        1 g 200 mL/hr over 30 Minutes Intravenous  Once 06/25/20 1500 06/25/20 1625   06/25/20 1515  azithromycin (ZITHROMAX) 500 mg in sodium chloride 0.9 % 250 mL IVPB          500 mg 250 mL/hr over 60 Minutes Intravenous Every 24 hours 06/25/20 1500        Subjective:  Patient seen and examined the bedside this morning.  Hemodynamically stable.  Comfortable during my evaluation.  On room air.  Has some cough.  Daughter at the bedside.  Her orientation has improved.  Currently she is oriented to place and she has been communicating with her daughter.  Objective: Vitals:   06/26/20 2142 06/26/20 2148 06/27/20 0551 06/27/20 0748  BP: (!) 162/69  (!) 199/71   Pulse: 88 83 72   Resp: 18  18   Temp: 98.3 F (36.8 C)  97.8 F (36.6 C)   TempSrc:   Oral   SpO2: (!) 89% 92% 91% 91%  Weight:      Height:        Intake/Output Summary (Last 24 hours) at 06/27/2020 0801 Last data filed at 06/27/2020 6389 Gross per 24 hour  Intake 367.76 ml  Output 2550 ml  Net -2182.24 ml   Filed Weights   06/25/20 1118 06/26/20 0928 06/26/20 1028  Weight: 70.3 kg 71.4 kg 71.4 kg    Examination:  General exam: Pleasant elderly female, deconditioned, debilitated, elderly HEENT:PERRL,Oral mucosa moist, Ear/Nose normal on gross exam Respiratory system: Bilateral equal air entry, normal vesicular breath sounds, no wheezes or crackles  Cardiovascular system: S1 & S2 heard, RRR. No JVD, murmurs, rubs, gallops or clicks. Gastrointestinal system: Abdomen is nondistended, soft and nontender. No organomegaly or masses felt. Normal bowel sounds heard. Central nervous system: Alert and oriented to place. Extremities: 1+ bilateral lower extremity edema, no clubbing ,no cyanosis Skin: No rashes, lesions or ulcers,no icterus ,no pallor  Data Reviewed: I have personally reviewed following labs and imaging studies  CBC: Recent Labs  Lab 06/25/20 1203 06/26/20 0433  WBC 8.3 5.7  NEUTROABS 5.8  --   HGB 11.8* 11.0*  HCT 38.5 36.2  MCV 93.0 95.0  PLT 171 167   Basic Metabolic Panel: Recent Labs  Lab 06/25/20 1203 06/26/20 0433  NA 139 142  K 3.2* 4.0  CL 98 101  CO2 31  31  GLUCOSE 112* 134*  BUN 23 24*  CREATININE 0.68 0.67  CALCIUM 10.0 9.7   GFR: Estimated Creatinine Clearance: 47.8 mL/min (by C-G formula based on SCr of 0.67 mg/dL). Liver Function Tests: Recent Labs  Lab 06/25/20 1203 06/26/20 0433  AST 32 28  ALT 18 16  ALKPHOS 42 36*  BILITOT 0.9 0.8  PROT 7.1 6.6  ALBUMIN 3.8 3.3*   No results for input(s): LIPASE, AMYLASE in the last 168 hours. No results for input(s): AMMONIA in the last 168 hours. Coagulation Profile: No results for input(s): INR, PROTIME in the last 168 hours. Cardiac Enzymes: No results for input(s): CKTOTAL, CKMB, CKMBINDEX, TROPONINI in the last 168 hours. BNP (last 3 results) No results for input(s): PROBNP in the last 8760 hours. HbA1C: No results for input(s): HGBA1C  in the last 72 hours. CBG: No results for input(s): GLUCAP in the last 168 hours. Lipid Profile: No results for input(s): CHOL, HDL, LDLCALC, TRIG, CHOLHDL, LDLDIRECT in the last 72 hours. Thyroid Function Tests: No results for input(s): TSH, T4TOTAL, FREET4, T3FREE, THYROIDAB in the last 72 hours. Anemia Panel: No results for input(s): VITAMINB12, FOLATE, FERRITIN, TIBC, IRON, RETICCTPCT in the last 72 hours. Sepsis Labs: Recent Labs  Lab 06/25/20 1203 06/25/20 1445  LATICACIDVEN 0.9 0.9    Recent Results (from the past 240 hour(s))  Respiratory Panel by RT PCR (Flu A&B, Covid) - Nasopharyngeal Swab     Status: None   Collection Time: 06/25/20 11:31 AM   Specimen: Nasopharyngeal Swab  Result Value Ref Range Status   SARS Coronavirus 2 by RT PCR NEGATIVE NEGATIVE Final    Comment: (NOTE) SARS-CoV-2 target nucleic acids are NOT DETECTED.  The SARS-CoV-2 RNA is generally detectable in upper respiratoy specimens during the acute phase of infection. The lowest concentration of SARS-CoV-2 viral copies this assay can detect is 131 copies/mL. A negative result does not preclude SARS-Cov-2 infection and should not be used as the sole  basis for treatment or other patient management decisions. A negative result may occur with  improper specimen collection/handling, submission of specimen other than nasopharyngeal swab, presence of viral mutation(s) within the areas targeted by this assay, and inadequate number of viral copies (<131 copies/mL). A negative result must be combined with clinical observations, patient history, and epidemiological information. The expected result is Negative.  Fact Sheet for Patients:  https://www.moore.com/https://www.fda.gov/media/142436/download  Fact Sheet for Healthcare Providers:  https://www.young.biz/https://www.fda.gov/media/142435/download  This test is no t yet approved or cleared by the [place] FDA and  has been authorized for detection and/or diagnosis of SARS-CoV-2 by FDA under an Emergency Use Authorization (EUA). This EUA will remain  in effect (meaning this test can be used) for the duration of the COVID-19 declaration under Section 564(b)(1) of the Act, 21 U.S.C. section 360bbb-3(b)(1), unless the authorization is terminated or revoked sooner.     Influenza A by PCR NEGATIVE NEGATIVE Final   Influenza B by PCR NEGATIVE NEGATIVE Final    Comment: (NOTE) The Xpert Xpress SARS-CoV-2/FLU/RSV assay is intended as an aid in  the diagnosis of influenza from Nasopharyngeal swab specimens and  should not be used as a sole basis for treatment. Nasal washings and  aspirates are unacceptable for Xpert Xpress SARS-CoV-2/FLU/RSV  testing.  Fact Sheet for Patients: https://www.moore.com/https://www.fda.gov/media/142436/download  Fact Sheet for Healthcare Providers: https://www.young.biz/https://www.fda.gov/media/142435/download  This test is not yet approved or cleared by the [place] FDA and  has been authorized for detection and/or diagnosis of SARS-CoV-2 by  FDA under an Emergency Use Authorization (EUA). This EUA will remain  in effect (meaning this test can be used) for the duration of the  Covid-19 declaration under Section 564(b)(1) of the  Act, 21  U.S.C. section 360bbb-3(b)(1), unless the authorization is  terminated or revoked. Performed at [facility], [address] [place], [place] [address]   Culture, blood (routine x 2)     Status: None (Preliminary result)   Collection Time: 06/25/20 11:36 AM   Specimen: Left Antecubital; Blood  Result Value Ref Range Status   Specimen Description   Final    LEFT ANTECUBITAL BOTTLES DRAWN AEROBIC AND ANAEROBIC   Special Requests Blood Culture adequate volume  Final   Culture   Final    NO GROWTH < 24 HOURS Performed at [facility], [address]  [place], [place] [address]    Report Status PENDING  Incomplete  Culture, blood (routine x 2)     Status: None (Preliminary result)   Collection Time: 06/25/20 12:18 PM   Specimen: BLOOD LEFT ARM  Result Value Ref Range Status   Specimen Description BLOOD LEFT ARM BOTTLES DRAWN AEROBIC AND ANAEROBIC  Final   Special Requests Blood Culture adequate volume  Final   Culture   Final    NO GROWTH < 24 HOURS Performed at [facility], [address] [place], [place] [address]    Report Status PENDING  Incomplete         Radiology Studies: DG Chest Port 1 View  Result Date: 06/25/2020 CLINICAL DATA:  Patient with decreased oxygen saturation.  Cough. EXAM: PORTABLE CHEST 1 VIEW COMPARISON:  Chest radiograph 07/31/2013. FINDINGS: Monitoring leads overlie the patient. Stable cardiomegaly. Patchy consolidative opacity within the left lower lobe. No pleural effusion or pneumothorax. Incompletely visualized proximal right humerus fracture. Thoracic spine degenerative changes. IMPRESSION: 1. Patchy consolidation left lower lobe may represent atelectasis or infection. Followup PA and lateral chest X-ray is recommended in 3-4 weeks following trial of antibiotic therapy to ensure resolution and exclude underlying malignancy. 2. Redemonstrated proximal right humerus fracture. Electronically Signed   By: Annia Belt M.D.   On: 06/25/2020 12:48    ECHOCARDIOGRAM COMPLETE  Result Date: 06/26/2020    ECHOCARDIOGRAM REPORT   Patient Name:   [name] Date of Exam: 06/26/2020 Medical Rec #:  [id number]          Height:       63.0 in Accession #:    [id number]         Weight:       157.4 lb Date of Birth:  18-Jun-1931         BSA:          1.747 m Patient Age:    88 years           BP:           186/75 mmHg Patient Gender: F                  HR:           84 bpm. [facility] Procedure: 2D Echo and Strain Analysis Indications:    CHF-Acute Diastolic 428.31 / I50.31  History:        Patient has no prior history of Echocardiogram examinations.                 COPD; Risk Factors:Former Smoker, Hypertension and Dyslipidemia.  Sonographer:    Leta Jungling RDCS Referring Phys: Gomez Cleverly LAMA IMPRESSIONS  1. Left ventricular ejection fraction, by estimation, is 55 to 60%. The left ventricle has normal function. The left ventricle has no regional wall motion abnormalities. There is mild left ventricular hypertrophy. Left ventricular diastolic parameters are consistent with Grade I diastolic dysfunction (impaired relaxation). Elevated left atrial pressure.  2. Right ventricular systolic function is normal. The right ventricular size is normal.  3. Left atrial size was moderately dilated.  4. The mitral valve is normal in structure. Trivial mitral valve regurgitation. No evidence of mitral stenosis.  5. The aortic valve is tricuspid. Aortic valve regurgitation is mild. No aortic stenosis is present.  6. The inferior vena cava is normal in size with greater than 50% respiratory variability, suggesting right atrial pressure of 3 mmHg. FINDINGS  Left Ventricle: Left ventricular ejection fraction,  by estimation, is 55 to 60%. The left ventricle has normal function. The left ventricle has no regional wall motion abnormalities. The left ventricular internal cavity size was normal in size. There is  mild left ventricular hypertrophy. Left  ventricular diastolic parameters are consistent with Grade I diastolic dysfunction (impaired relaxation). Elevated left atrial pressure. Right Ventricle: The right ventricular size is normal. No increase in right ventricular wall thickness. Right ventricular systolic function is normal. Left Atrium: Left atrial size was moderately dilated. Right Atrium: Right atrial size was normal in size. Pericardium: There is no evidence of pericardial effusion. Mitral Valve: The mitral valve is normal in structure. Trivial mitral valve regurgitation. No evidence of mitral valve stenosis. Tricuspid Valve: The tricuspid valve is normal in structure. Tricuspid valve regurgitation is not demonstrated. No evidence of tricuspid stenosis. Aortic Valve: The aortic valve is tricuspid. Aortic valve regurgitation is mild. Aortic regurgitation PHT measures 368 msec. No aortic stenosis is present. Aortic valve mean gradient measures 5.8 mmHg. Aortic valve peak gradient measures 10.3 mmHg. Aortic valve area, by VTI measures 2.26 cm. Pulmonic Valve: The pulmonic valve was not well visualized. Pulmonic valve regurgitation is not visualized. No evidence of pulmonic stenosis. Aorta: The aortic root is normal in size and structure. Pulmonary Artery: Indeterminant PASP, inadequate TR jet. Venous: The inferior vena cava is normal in size with greater than 50% respiratory variability, suggesting right atrial pressure of 3 mmHg. IAS/Shunts: No atrial level shunt detected by color flow Doppler.  LEFT VENTRICLE PLAX 2D LVIDd:         4.01 cm  Diastology LVIDs:         2.90 cm  LV e' medial:    4.13 cm/s LV PW:         1.21 cm  LV E/e' medial:  18.1 LV IVS:        1.36 cm  LV e' lateral:   5.33 cm/s LVOT diam:     2.10 cm  LV E/e' lateral: 14.0 LV SV:         77 LV SV Index:   44 LVOT Area:     3.46 cm  RIGHT VENTRICLE RV S prime:     15.10 cm/s TAPSE (M-mode): 2.1 cm LEFT ATRIUM             Index       RIGHT ATRIUM           Index LA diam:        4.20  cm 2.40 cm/m  RA Area:     14.30 cm LA Vol (A2C):   61.0 ml 34.93 ml/m RA Volume:   32.10 ml  18.38 ml/m LA Vol (A4C):   67.5 ml 38.65 ml/m LA Biplane Vol: 66.8 ml 38.25 ml/m  AORTIC VALVE AV Area (Vmax):    2.66 cm AV Area (Vmean):   2.33 cm AV Area (VTI):     2.26 cm AV Vmax:           160.17 cm/s AV Vmean:          111.761 cm/s AV VTI:            0.342 m AV Peak Grad:      10.3 mmHg AV Mean Grad:      5.8 mmHg LVOT Vmax:         123.22 cm/s LVOT Vmean:        75.073 cm/s LVOT VTI:          0.223 m  LVOT/AV VTI ratio: 0.65 AI PHT:            368 msec  AORTA Ao Root diam: 3.00 cm Ao Asc diam:  3.60 cm MITRAL VALVE MV Area (PHT): 6.32 cm     SHUNTS MV Decel Time: 120 msec     Systemic VTI:  0.22 m MV E velocity: 74.70 cm/s   Systemic Diam: 2.10 cm MV A velocity: 135.00 cm/s MV E/A ratio:  0.55 Dina Rich MD Electronically signed by Dina Rich MD Signature Date/Time: 06/26/2020/3:52:39 PM    Final         Scheduled Meds: . amLODipine  10 mg Oral Daily  . enoxaparin (LOVENOX) injection  40 mg Subcutaneous Q24H  . fenofibrate  160 mg Oral Daily  . furosemide  40 mg Intravenous Q12H  . guaiFENesin  600 mg Oral BID  . ipratropium-albuterol  3 mL Nebulization Q6H  . levothyroxine  75 mcg Oral Q0600  . lisinopril  40 mg Oral Daily  . montelukast  10 mg Oral Daily  . predniSONE  40 mg Oral Q breakfast  . propranolol  10 mg Oral BID  . sodium chloride flush  3 mL Intravenous Q12H   Continuous Infusions: . sodium chloride 250 mL (06/26/20 1108)  . sodium chloride    . azithromycin 500 mg (06/26/20 1531)  . cefTRIAXone (ROCEPHIN)  IV 1 g (06/26/20 1110)     LOS: 1 day    Time spent:35 mins. More than 50% of that time was spent in counseling and/or coordination of care.      Burnadette Pop, MD [facility] P9/28/2021, 8:01 AM

## 2020-06-27 NOTE — TOC Transition Note (Signed)
Transition of Care [facility]) - CM/SW Discharge Note   Patient Details  Name: [name] MRN: [id number] Date of Birth: [birth year]  Transition of Care [facility]) CM/SW Contact:  Villa Herb, LCSWA Phone Number: 06/27/2020, 1:32 PM   Clinical Narrative:    TOC confirmed referral with Bonita Quin at [facility]. Bonita Quin accepted referral for [facility] PT/OT. TOC updated Bonita Quin of pt discharging today 9/28. TOC made referral to [facility] for rolling walker and nebulizer. [facility] accepted, [facility] to deliver. TOC signing off.    Final next level of care: Home w Home Health Services Barriers to Discharge: Continued Medical Work up   Patient Goals and CMS Choice Patient states their goals for this hospitalization and ongoing recovery are:: to go home. CMS Medicare.gov Compare Post Acute Care list provided to:: Other (Comment Required) (Grand-Daughter)    Discharge Placement                       Discharge Plan and Services   Discharge Planning Services: CM Consult            DME Arranged: Dan Humphreys rolling DME Agency: [facility] Date DME Agency Contacted: 06/26/20 Time DME Agency Contacted: 1100 Representative spoke with at DME Agency: [facility] HH Arranged: PT, OT HH Agency: [facility] Date HH Agency Contacted: 06/27/20 Time HH Agency Contacted: 1332 Representative spoke with at [facility] Agency: Bonita Quin  Social Determinants of Health (SDOH) Interventions     Readmission Risk Interventions No flowsheet data found.

## 2020-06-28 ENCOUNTER — Inpatient Hospital Stay (HOSPITAL_COMMUNITY)
Admit: 2020-06-28 | Discharge: 2020-07-02 | DRG: 951 | Disposition: A | Discharge status: Hospice | Source: Intra-hospital | Attending: Family Medicine | Admitting: Family Medicine

## 2020-06-28 DIAGNOSIS — J9601 Acute respiratory failure with hypoxia: Secondary | ICD-10-CM | POA: Diagnosis not present

## 2020-06-28 DIAGNOSIS — Z90710 Acquired absence of both cervix and uterus: Secondary | ICD-10-CM

## 2020-06-28 DIAGNOSIS — Z743 Need for continuous supervision: Secondary | ICD-10-CM | POA: Diagnosis not present

## 2020-06-28 DIAGNOSIS — R0902 Hypoxemia: Secondary | ICD-10-CM | POA: Diagnosis not present

## 2020-06-28 DIAGNOSIS — E7800 Pure hypercholesterolemia, unspecified: Secondary | ICD-10-CM | POA: Diagnosis present

## 2020-06-28 DIAGNOSIS — J189 Pneumonia, unspecified organism: Secondary | ICD-10-CM | POA: Diagnosis not present

## 2020-06-28 DIAGNOSIS — R404 Transient alteration of awareness: Secondary | ICD-10-CM | POA: Diagnosis not present

## 2020-06-28 DIAGNOSIS — Z87891 Personal history of nicotine dependence: Secondary | ICD-10-CM

## 2020-06-28 DIAGNOSIS — J440 Chronic obstructive pulmonary disease with acute lower respiratory infection: Secondary | ICD-10-CM | POA: Diagnosis present

## 2020-06-28 DIAGNOSIS — Z66 Do not resuscitate: Secondary | ICD-10-CM | POA: Diagnosis present

## 2020-06-28 DIAGNOSIS — Z515 Encounter for palliative care: Principal | ICD-10-CM | POA: Diagnosis present

## 2020-06-28 DIAGNOSIS — E785 Hyperlipidemia, unspecified: Secondary | ICD-10-CM | POA: Diagnosis present

## 2020-06-28 DIAGNOSIS — E039 Hypothyroidism, unspecified: Secondary | ICD-10-CM | POA: Diagnosis present

## 2020-06-28 DIAGNOSIS — I10 Essential (primary) hypertension: Secondary | ICD-10-CM | POA: Diagnosis present

## 2020-06-28 DIAGNOSIS — Z20822 Contact with and (suspected) exposure to covid-19: Secondary | ICD-10-CM | POA: Diagnosis present

## 2020-06-28 LAB — BASIC METABOLIC PANEL
Anion gap: 8 (ref 5–15)
BUN: 39 mg/dL — ABNORMAL HIGH (ref 8–23)
CO2: 38 mmol/L — ABNORMAL HIGH (ref 22–32)
Calcium: 9.6 mg/dL (ref 8.9–10.3)
Chloride: 93 mmol/L — ABNORMAL LOW (ref 98–111)
Creatinine, Ser: 0.78 mg/dL (ref 0.44–1.00)
GFR calc Af Amer: >60 mL/min (ref >60)
GFR calc non Af Amer: >60 mL/min (ref >60)
Glucose, Bld: 124 mg/dL — ABNORMAL HIGH (ref 70–99)
Potassium: 3.3 mmol/L — ABNORMAL LOW (ref 3.5–5.1)
Sodium: 139 mmol/L (ref 135–145)

## 2020-06-28 LAB — CBC WITH DIFFERENTIAL/PLATELET
Abs Immature Granulocytes: 0.05 10*3/uL (ref 0.00–0.07)
Basophils Absolute: 0 10*3/uL (ref 0.0–0.1)
Basophils Relative: 0 %
Eosinophils Absolute: 0 10*3/uL (ref 0.0–0.5)
Eosinophils Relative: 0 %
HCT: 39.4 % (ref 36.0–46.0)
Hemoglobin: 12.2 g/dL (ref 12.0–15.0)
Immature Granulocytes: 0 %
Lymphocytes Relative: 25 %
Lymphs Abs: 3.1 10*3/uL (ref 0.7–4.0)
MCH: 29 pg (ref 26.0–34.0)
MCHC: 31 g/dL (ref 30.0–36.0)
MCV: 93.8 fL (ref 80.0–100.0)
Monocytes Absolute: 1 10*3/uL (ref 0.1–1.0)
Monocytes Relative: 8 %
Neutro Abs: 8.4 10*3/uL — ABNORMAL HIGH (ref 1.7–7.7)
Neutrophils Relative %: 67 %
Platelets: 202 10*3/uL (ref 150–400)
RBC: 4.2 MIL/uL (ref 3.87–5.11)
RDW: 14.6 % (ref 11.5–15.5)
WBC: 12.6 10*3/uL — ABNORMAL HIGH (ref 4.0–10.5)
nRBC: 0 % (ref 0.0–0.2)

## 2020-06-28 LAB — URINE CULTURE

## 2020-06-28 MED ORDER — GLYCOPYRROLATE 0.2 MG/ML IJ SOLN: 0.2000 mg | INTRAMUSCULAR | Status: DC | PRN

## 2020-06-28 MED ORDER — ACETAMINOPHEN 650 MG RE SUPP: 650.0000 mg | Freq: Four times a day (QID) | RECTAL | Status: DC | PRN

## 2020-06-28 MED ORDER — HALOPERIDOL 0.5 MG PO TABS: 0.5000 mg | ORAL_TABLET | ORAL | Status: DC | PRN

## 2020-06-28 MED ORDER — POLYVINYL ALCOHOL 1.4 % OP SOLN: 1.0000 [drp] | Freq: Four times a day (QID) | OPHTHALMIC | 0 refills | Status: AC | PRN

## 2020-06-28 MED ORDER — BIOTENE DRY MOUTH MT LIQD: 15.0000 mL | OROMUCOSAL | Status: AC | PRN

## 2020-06-28 MED ORDER — HALOPERIDOL LACTATE 2 MG/ML PO CONC
0.5000 mg | ORAL | Status: DC | PRN
  Filled 2020-06-28: qty 0.3

## 2020-06-28 MED ORDER — GLYCOPYRROLATE 1 MG PO TABS: 1.0000 mg | ORAL_TABLET | ORAL | Status: DC | PRN

## 2020-06-28 MED ORDER — ATROPINE SULFATE 1 % OP SOLN: 4.0000 [drp] | OPHTHALMIC | 12 refills | Status: AC | PRN

## 2020-06-28 MED ORDER — ACETAMINOPHEN 325 MG PO TABS: 650.0000 mg | ORAL_TABLET | Freq: Four times a day (QID) | ORAL | Status: DC | PRN

## 2020-06-28 MED ORDER — ONDANSETRON 4 MG PO TBDP: 4.0000 mg | ORAL_TABLET | Freq: Four times a day (QID) | ORAL | Status: DC | PRN

## 2020-06-28 MED ORDER — MORPHINE SULFATE (CONCENTRATE) 10 MG/0.5ML PO SOLN
5.0000 mg | ORAL | Status: DC | PRN
  Administered 2020-06-28 – 2020-07-02 (×6): 5 mg via SUBLINGUAL
  Filled 2020-06-28 (×9): qty 0.5

## 2020-06-28 MED ORDER — ONDANSETRON HCL 4 MG/2ML IJ SOLN: 4.0000 mg | Freq: Four times a day (QID) | INTRAMUSCULAR | Status: DC | PRN

## 2020-06-28 MED ORDER — POLYVINYL ALCOHOL 1.4 % OP SOLN: 1.0000 [drp] | Freq: Four times a day (QID) | OPHTHALMIC | Status: DC | PRN

## 2020-06-28 MED ORDER — BIOTENE DRY MOUTH MT LIQD: 15.0000 mL | OROMUCOSAL | Status: DC | PRN

## 2020-06-28 MED ORDER — HALOPERIDOL LACTATE 5 MG/ML IJ SOLN
0.5000 mg | INTRAMUSCULAR | Status: DC | PRN
  Administered 2020-06-28 – 2020-07-02 (×2): 0.5 mg via INTRAVENOUS
  Filled 2020-06-28 (×2): qty 1

## 2020-06-28 MED ORDER — MORPHINE SULFATE (CONCENTRATE) 10 MG/0.5ML PO SOLN
5.0000 mg | ORAL | Status: DC | PRN
  Administered 2020-06-30 – 2020-07-02 (×2): 5 mg via ORAL

## 2020-06-28 NOTE — TOC Progression Note (Signed)
Transition of Care [facility]) - Progression Note    Patient Details  Name: [name] MRN: [id number] Date of Birth: October 29, 1930  Transition of Care [facility]) CM/SW Contact  Salome Arnt, [place] Phone Number: 06/28/2020, 1:21 PM  Clinical Narrative:  Pt now comfort care. LCSW met with pt's 2 daughters at bedside to discuss hospice. Daughters are in agreement and referral made to [facility]. Per Cassandra at facility, no beds available today. Hospice to have paperwork signed later this afternoon and will flip to GIP. MD and RN notified. Family aware that if bed opens up at [facility] and pt is stable, plan will be to transfer to residential hospice. TOC to follow.     Expected Discharge Plan: [place] Barriers to Discharge: Barriers Resolved  Expected Discharge Plan and Services Expected Discharge Plan: [place]   Discharge Planning Services: CM Consult   Living arrangements for the past 2 months: Single Family Home                 DME Arranged: Walker rolling DME Agency: [facility] Date DME Agency Contacted: 06/26/20 Time DME Agency Contacted: 1100 Representative spoke with at DME Agency: Barbaraann Rondo [place]: PT, OT [place] Agency: [place] ([place]) Date [place]: 06/27/20 Time [place]: 1332 Representative spoke with at [place]: [place] ([place]) Interventions    Readmission Risk Interventions No flowsheet data found.

## 2020-06-28 NOTE — H&P (Signed)
TRH H&P    Patient Demographics:    [name], is a 86 y.o. female  MRN: [id number]  DOB - [birth year]  Admit Date - 06/28/2020    Chief complaint-acute hypoxemic respiratory failure   HPI:    [name]  is a 86 y.o. female, with history of COPD, hyperlipidemia, hypertension, hypothyroidism who presented with shortness of breath.  Chest x-ray showed patchy consolidation left lower lobe, patient was started on IV antibiotics however patient condition worsened and was requiring 10 L of oxygen via nasal cannula.  Patient continued to be confused and agitated, and was made DNR.  After discussion with family patient was made full comfort care and admitted to hospice.    Review of systems:    In addition to the HPI above,    All other systems reviewed and are negative.    Past History of the following :    Past Medical History:  Diagnosis Date  . COPD (chronic obstructive pulmonary disease) (HCC)   . Hypercholesterolemia   . Hypertension   . Thyroid disease       Past Surgical History:  Procedure Laterality Date  . ABDOMINAL HYSTERECTOMY    . DILATION AND CURETTAGE OF UTERUS    . nephrostomy tube        Social History:      Social History   Tobacco Use  . Smoking status: Never Smoker  . Smokeless tobacco: Never Used  Substance Use Topics  . Alcohol use: No       Family History :   Unobtainable   Home Medications:   Prior to Admission medications   Medication Sig Start Date End Date Taking? Authorizing Provider  acetaminophen (TYLENOL) 500 MG tablet Take 500 mg by mouth every 6 (six) hours as needed.    [provider]  antiseptic oral rinse (BIOTENE) LIQD Apply 15 mLs topically as needed for dry mouth. 06/28/20   Meredeth Ide, MD  atropine 1 % ophthalmic solution Place 4 drops under the tongue every 4 (four) hours as needed (excessive secretions). 06/28/20    Meredeth Ide, MD  ipratropium-albuterol (DUONEB) 0.5-2.5 (3) MG/3ML SOLN Take 3 mLs by nebulization every 6 (six) hours as needed. 06/27/20   Burnadette Pop, MD  polyvinyl alcohol (LIQUIFILM TEARS) 1.4 % ophthalmic solution Place 1 drop into both eyes 4 (four) times daily as needed for dry eyes. 06/28/20   Meredeth Ide, MD     Allergies:    No Known Allergies   Physical Exam:   Vitals  There were no vitals taken for this visit.  1.  General: Appears in mild respiratory distress, agitated  2. Psychiatric: Somnolent, arousable to sternal rub  3. Neurologic: Moving all extremities  4. HEENMT:  Atraumatic, normocephalic  5. Respiratory : Decreased breath sounds bilaterally  6. Cardiovascular : S1-S2, regular, no murmur auscultated  7. Gastrointestinal:  Abdomen is soft, nontender, no organomegaly      Data Review:    CBC Recent Labs  Lab 06/25/20 1203 06/26/20 0433 06/27/20 1845 06/28/20  0705  WBC 8.3 5.7 15.3* 12.6*  HGB 11.8* 11.0* 13.4 12.2  HCT 38.5 36.2 43.5 39.4  PLT 171 167 295 202  MCV 93.0 95.0 92.2 93.8  MCH 28.5 28.9 28.4 29.0  MCHC 30.6 30.4 30.8 31.0  RDW 14.4 14.7 14.4 14.6  LYMPHSABS 1.8  --   --  3.1  MONOABS 0.6  --   --  1.0  EOSABS 0.1  --   --  0.0  BASOSABS 0.0  --   --  0.0   ------------------------------------------------------------------------------------------------------------------  Results for orders placed or performed during the hospital encounter of 06/25/20 (from the past 48 hour(s))  Glucose, capillary     Status: Abnormal   Collection Time: 06/27/20  6:31 PM  Result Value Ref Range   Glucose-Capillary 230 (H) 70 - 99 mg/dL    Comment: Glucose reference range applies only to samples taken after fasting for at least 8 hours.  CBC     Status: Abnormal   Collection Time: 06/27/20  6:45 PM  Result Value Ref Range   WBC 15.3 (H) 4.0 - 10.5 K/uL   RBC 4.72 3.87 - 5.11 MIL/uL   Hemoglobin 13.4 12.0 - 15.0 g/dL   HCT  28.3 36 - 46 %   MCV 92.2 80.0 - 100.0 fL   MCH 28.4 26.0 - 34.0 pg   MCHC 30.8 30.0 - 36.0 g/dL   RDW 15.1 76.1 - 60.7 %   Platelets 295 150 - 400 K/uL   nRBC 0.0 0.0 - 0.2 %    Comment: Performed at [facility], [address], [place], [place] [address]  Comprehensive metabolic panel     Status: Abnormal   Collection Time: 06/27/20  6:45 PM  Result Value Ref Range   Sodium 138 135 - 145 mmol/L   Potassium 3.0 (L) 3.5 - 5.1 mmol/L    Comment: DELTA CHECK NOTED   Chloride 91 (L) 98 - 111 mmol/L   CO2 27 22 - 32 mmol/L   Glucose, Bld 249 (H) 70 - 99 mg/dL    Comment: Glucose reference range applies only to samples taken after fasting for at least 8 hours.   BUN 32 (H) 8 - 23 mg/dL   Creatinine, Ser 2.69 (H) 0.44 - 1.00 mg/dL   Calcium 48.5 8.9 - 46.2 mg/dL   Total Protein 7.2 6.5 - 8.1 g/dL   Albumin 3.5 3.5 - 5.0 g/dL   AST 40 15 - 41 U/L   ALT <5 0 - 44 U/L   Alkaline Phosphatase 43 38 - 126 U/L   Total Bilirubin 0.7 0.3 - 1.2 mg/dL   GFR calc non Af Amer 49 (L) >60 mL/min   GFR calc Af Amer 57 (L) >60 mL/min   Anion gap 20 (H) 5 - 15    Comment: Performed at [facility], [address], [place], [place] [address]  Troponin I (High Sensitivity)     Status: Abnormal   Collection Time: 06/27/20  6:45 PM  Result Value Ref Range   Troponin I (High Sensitivity) 50 (H) <18 ng/L    Comment: (NOTE) Elevated high sensitivity troponin I (hsTnI) values and significant  changes across serial measurements may suggest ACS but many other  chronic and acute conditions are known to elevate hsTnI results.  Refer to the Links section for chest pain algorithms and additional  guidance. Performed at [facility], [address], [place], [place] [address]   CBC with Differential/Platelet     Status: Abnormal  Collection Time: 06/28/20  7:05 AM  Result Value Ref Range   WBC 12.6 (H) 4.0 - 10.5 K/uL   RBC 4.20 3.87 - 5.11 MIL/uL   Hemoglobin 12.2 12.0 - 15.0 g/dL   HCT 95.6 36 - 46 %    MCV 93.8 80.0 - 100.0 fL   MCH 29.0 26.0 - 34.0 pg   MCHC 31.0 30.0 - 36.0 g/dL   RDW 38.7 56.4 - 33.2 %   Platelets 202 150 - 400 K/uL   nRBC 0.0 0.0 - 0.2 %   Neutrophils Relative % 67 %   Neutro Abs 8.4 (H) 1.7 - 7.7 K/uL   Lymphocytes Relative 25 %   Lymphs Abs 3.1 0.7 - 4.0 K/uL   Monocytes Relative 8 %   Monocytes Absolute 1.0 0 - 1 K/uL   Eosinophils Relative 0 %   Eosinophils Absolute 0.0 0 - 0 K/uL   Basophils Relative 0 %   Basophils Absolute 0.0 0 - 0 K/uL   Immature Granulocytes 0 %   Abs Immature Granulocytes 0.05 0.00 - 0.07 K/uL    Comment: Performed at [facility], [address] [place], [place] [address]  Basic metabolic panel     Status: Abnormal   Collection Time: 06/28/20  7:05 AM  Result Value Ref Range   Sodium 139 135 - 145 mmol/L   Potassium 3.3 (L) 3.5 - 5.1 mmol/L   Chloride 93 (L) 98 - 111 mmol/L   CO2 38 (H) 22 - 32 mmol/L   Glucose, Bld 124 (H) 70 - 99 mg/dL    Comment: Glucose reference range applies only to samples taken after fasting for at least 8 hours.   BUN 39 (H) 8 - 23 mg/dL   Creatinine, Ser 4.16 0.44 - 1.00 mg/dL   Calcium 9.6 8.9 - 60.6 mg/dL   GFR calc non Af Amer >60 >60 mL/min   GFR calc Af Amer >60 >60 mL/min   Anion gap 8 5 - 15    Comment: Performed at [facility], [address] [place], [place] [address]    Chemistries  Recent Labs  Lab 06/25/20 1203 06/26/20 0433 06/27/20 1845 06/28/20 0705  NA 139 142 138 139  K 3.2* 4.0 3.0* 3.3*  CL 98 101 91* 93*  CO2 31 31 27  38*  GLUCOSE 112* 134* 249* 124*  BUN 23 24* 32* 39*  CREATININE 0.68 0.67 1.03* 0.78  CALCIUM 10.0 9.7 10.0 9.6  AST 32 28 40  --   ALT 18 16 <5  --   ALKPHOS 42 36* 43  --   BILITOT 0.9 0.8 0.7  --    ------------------------------------------------------------------------------------------------------------------  ------------------------------------------------------------------------------------------------------------------ GFR: Estimated  Creatinine Clearance: 47.8 mL/min (by C-G formula based on SCr of 0.78 mg/dL). Liver Function Tests: Recent Labs  Lab 06/25/20 1203 06/26/20 0433 06/27/20 1845  AST 32 28 40  ALT 18 16 <5  ALKPHOS 42 36* 43  BILITOT 0.9 0.8 0.7  PROT 7.1 6.6 7.2  ALBUMIN 3.8 3.3* 3.5   No results for input(s): LIPASE, AMYLASE in the last 168 hours. No results for input(s): AMMONIA in the last 168 hours. Coagulation Profile: No results for input(s): INR, PROTIME in the last 168 hours. Cardiac Enzymes: No results for input(s): CKTOTAL, CKMB, CKMBINDEX, TROPONINI in the last 168 hours. BNP (last 3 results) No results for input(s): PROBNP in the last 8760 hours. HbA1C: No results for input(s): HGBA1C in the last 72 hours. CBG: Recent Labs  Lab 06/27/20 1831  GLUCAP 230*   Lipid Profile: No results for input(s): CHOL, HDL, LDLCALC, TRIG, CHOLHDL, LDLDIRECT in the last 72 hours. Thyroid Function Tests: No results for input(s): TSH, T4TOTAL, FREET4, T3FREE, THYROIDAB in the last 72 hours. Anemia Panel: No results for input(s): VITAMINB12, FOLATE, FERRITIN, TIBC, IRON, RETICCTPCT in the last 72 hours.  --------------------------------------------------------------------------------------------------------------- Urine analysis:    Component Value Date/Time   COLORURINE YELLOW 06/25/2020 1802   APPEARANCEUR HAZY (A) 06/25/2020 1802   LABSPEC 1.015 06/25/2020 1802   PHURINE 6.0 06/25/2020 1802   GLUCOSEU NEGATIVE 06/25/2020 1802   HGBUR NEGATIVE 06/25/2020 1802   BILIRUBINUR NEGATIVE 06/25/2020 1802   KETONESUR NEGATIVE 06/25/2020 1802   PROTEINUR >=300 (A) 06/25/2020 1802   UROBILINOGEN 0.2 07/31/2013 0608   NITRITE NEGATIVE 06/25/2020 1802   LEUKOCYTESUR MODERATE (A) 06/25/2020 1802      Imaging Results:    No results found.    Assessment & Plan:    Active Problems:   CAP (community acquired pneumonia)   Acute hypoxemic respiratory failure (HCC)   1. Acute hypoxemic  respiratory failure-secondary to pneumonia, underlying COPD.  No intubation mechanical ventilation, requiring 10 L/min of oxygen via nasal cannula.  Patient has been made full comfort care after discussion with family.  Plan for morphine as needed for dyspnea.  Patient be transferred to residential hospice once bed becomes available. Discussed with patient's daughters at bedside.  DVT Prophylaxis-   none, patient is comfort care only  AM Labs Ordered, also please review Full Orders   Time spent in minutes : 60 minutes   Seraphim Affinito S Matthieu Loftus M.D

## 2020-06-28 NOTE — Discharge Summary (Signed)
Physician Discharge Summary  [name] EGB:[id number] DOB: [birth year] DOA: 06/25/2020  PCP: Benita Stabile, MD  Admit date: 06/25/2020 Discharge date: 06/28/2020  Time spent: 40* minutes  Recommendations for Outpatient Follow-up:  1. Patient will be GIP awaiting residential hospice bed  Discharge Diagnoses:  Active Problems:   CAP (community acquired pneumonia)   Discharge Condition: Stable  Diet recommendation: Comfort diet  Filed Weights   06/25/20 1118 06/26/20 0928 06/26/20 1028  Weight: 70.3 kg 71.4 kg 71.4 kg    History of present illness:  86 year old female with history of COPD, hyperlipidemia, hypertension, hypothyroidism who presented with complaint of shortness of breath.  She was taken to the urgent care where she was found to have saturation of 83% on room air  was sent to the emergency department.  She was complaining of dyspnea on exertion for last 3 to 4 months.  Also was coughing.  We will do screening test was negative, patient is unvaccinated.  Chest x-ray presentation showed left lower lobe consolidation versus atelectasis.  She was also found to have severe bilateral lower extremity edema. she was admitted for the management of community-acquired pneumonia, COPD exacerbation  Hospital Course:  Acute hypoxemic respiratory failure-patient presented with community-acquired pneumonia chest x-ray showed patchy consolidation left lower lobe.  She also has underlying COPD, diastolic dysfunction.  Patient was started on IV antibiotics, however patient became hypoxemic with O2 sats in the 80s requiring nonrebreather bag.  She was unresponsive to noxious stimuli.  At that time after discussion with family patient was made DNR/DNI and transition to full comfort care. I again evaluated patient, she is still requiring 10 L of oxygen via nasal cannula.  Continues to be confused.  Discussed with patient's daughters at bedside, patient has extremely poor prognosis with  worsening hypoxemic respiratory failure.  She also has history of dysphagia and will not be a good candidate for long-term feeding with PEG tube placement.  Patient does have underlying chronic lung disease,?  Pulmonary fibrosis though she was never diagnosed officially. At this time family has decided to make her full comfort care and transition to hospice.  Procedures:    Consultations:    Discharge Exam: Vitals:   06/27/20 1428 06/28/20 0635  BP:  (!) 131/56  Pulse:  (!) 59  Resp:  20  Temp:  98.1 F (36.7 C)  SpO2: 91% 100%    General: Appears mildly agitated Cardiovascular: S1-S2, regular, no murmur auscultated Respiratory: Decreased breath sounds bilaterally  Discharge Instructions   Discharge Instructions    Diet - low sodium heart healthy   Complete by: As directed    Increase activity slowly   Complete by: As directed      Allergies as of 06/28/2020   No Known Allergies     Medication List    STOP taking these medications   fenofibrate micronized 134 MG capsule Commonly known as: LOFIBRA   levothyroxine 75 MCG tablet Commonly known as: SYNTHROID   lisinopril 40 MG tablet Commonly known as: ZESTRIL   montelukast 10 MG tablet Commonly known as: SINGULAIR   propranolol 10 MG tablet Commonly known as: INDERAL     TAKE these medications   acetaminophen 500 MG tablet Commonly known as: TYLENOL Take 500 mg by mouth every 6 (six) hours as needed.   antiseptic oral rinse Liqd Apply 15 mLs topically as needed for dry mouth.   atropine 1 % ophthalmic solution Place 4 drops under the tongue every 4 (four) hours as  needed (excessive secretions).   ipratropium-albuterol 0.5-2.5 (3) MG/3ML Soln Commonly known as: DUONEB Take 3 mLs by nebulization every 6 (six) hours as needed.   polyvinyl alcohol 1.4 % ophthalmic solution Commonly known as: LIQUIFILM TEARS Place 1 drop into both eyes 4 (four) times daily as needed for dry eyes.             Durable Medical Equipment  (From admission, onward)         Start     Ordered   06/27/20 1123  For home use only DME Nebulizer machine  Once       Question Answer Comment  Patient needs a nebulizer to treat with the following condition COPD exacerbation (HCC)   Length of Need Lifetime      06/27/20 1123         No Known Allergies    The results of significant diagnostics from this hospitalization (including imaging, microbiology, ancillary and laboratory) are listed below for reference.    Significant Diagnostic Studies: DG Chest Port 1 View  Result Date: 06/25/2020 CLINICAL DATA:  Patient with decreased oxygen saturation.  Cough. EXAM: PORTABLE CHEST 1 VIEW COMPARISON:  Chest radiograph 07/31/2013. FINDINGS: Monitoring leads overlie the patient. Stable cardiomegaly. Patchy consolidative opacity within the left lower lobe. No pleural effusion or pneumothorax. Incompletely visualized proximal right humerus fracture. Thoracic spine degenerative changes. IMPRESSION: 1. Patchy consolidation left lower lobe may represent atelectasis or infection. Followup PA and lateral chest X-ray is recommended in 3-4 weeks following trial of antibiotic therapy to ensure resolution and exclude underlying malignancy. 2. Redemonstrated proximal right humerus fracture. Electronically Signed   By: Annia Belt M.D.   On: 06/25/2020 12:48   ECHOCARDIOGRAM COMPLETE  Result Date: 06/26/2020    ECHOCARDIOGRAM REPORT   Patient Name:   [name] Date of Exam: 06/26/2020 Medical Rec #:  [id number]          Height:       63.0 in Accession #:    [id number]         Weight:       157.4 lb Date of Birth:  April 04, 1931         BSA:          1.747 m Patient Age:    88 years           BP:           186/75 mmHg Patient Gender: F                  HR:           84 bpm. [facility] Procedure: 2D Echo and Strain Analysis Indications:    CHF-Acute Diastolic 428.31 / I50.31  History:        Patient has no prior  history of Echocardiogram examinations.                 COPD; Risk Factors:Former Smoker, Hypertension and Dyslipidemia.  Sonographer:    Leta Jungling RDCS Referring Phys: Gomez Cleverly Keandre Linden IMPRESSIONS  1. Left ventricular ejection fraction, by estimation, is 55 to 60%. The left ventricle has normal function. The left ventricle has no regional wall motion abnormalities. There is mild left ventricular hypertrophy. Left ventricular diastolic parameters are consistent with Grade I diastolic dysfunction (impaired relaxation). Elevated left atrial pressure.  2. Right ventricular systolic function is normal. The right ventricular size is normal.  3. Left atrial size was moderately dilated.  4.  The mitral valve is normal in structure. Trivial mitral valve regurgitation. No evidence of mitral stenosis.  5. The aortic valve is tricuspid. Aortic valve regurgitation is mild. No aortic stenosis is present.  6. The inferior vena cava is normal in size with greater than 50% respiratory variability, suggesting right atrial pressure of 3 mmHg. FINDINGS  Left Ventricle: Left ventricular ejection fraction, by estimation, is 55 to 60%. The left ventricle has normal function. The left ventricle has no regional wall motion abnormalities. The left ventricular internal cavity size was normal in size. There is  mild left ventricular hypertrophy. Left ventricular diastolic parameters are consistent with Grade I diastolic dysfunction (impaired relaxation). Elevated left atrial pressure. Right Ventricle: The right ventricular size is normal. No increase in right ventricular wall thickness. Right ventricular systolic function is normal. Left Atrium: Left atrial size was moderately dilated. Right Atrium: Right atrial size was normal in size. Pericardium: There is no evidence of pericardial effusion. Mitral Valve: The mitral valve is normal in structure. Trivial mitral valve regurgitation. No evidence of mitral valve stenosis. Tricuspid Valve:  The tricuspid valve is normal in structure. Tricuspid valve regurgitation is not demonstrated. No evidence of tricuspid stenosis. Aortic Valve: The aortic valve is tricuspid. Aortic valve regurgitation is mild. Aortic regurgitation PHT measures 368 msec. No aortic stenosis is present. Aortic valve mean gradient measures 5.8 mmHg. Aortic valve peak gradient measures 10.3 mmHg. Aortic valve area, by VTI measures 2.26 cm. Pulmonic Valve: The pulmonic valve was not well visualized. Pulmonic valve regurgitation is not visualized. No evidence of pulmonic stenosis. Aorta: The aortic root is normal in size and structure. Pulmonary Artery: Indeterminant PASP, inadequate TR jet. Venous: The inferior vena cava is normal in size with greater than 50% respiratory variability, suggesting right atrial pressure of 3 mmHg. IAS/Shunts: No atrial level shunt detected by color flow Doppler.  LEFT VENTRICLE PLAX 2D LVIDd:         4.01 cm  Diastology LVIDs:         2.90 cm  LV e' medial:    4.13 cm/s LV PW:         1.21 cm  LV E/e' medial:  18.1 LV IVS:        1.36 cm  LV e' lateral:   5.33 cm/s LVOT diam:     2.10 cm  LV E/e' lateral: 14.0 LV SV:         77 LV SV Index:   44 LVOT Area:     3.46 cm  RIGHT VENTRICLE RV S prime:     15.10 cm/s TAPSE (M-mode): 2.1 cm LEFT ATRIUM             Index       RIGHT ATRIUM           Index LA diam:        4.20 cm 2.40 cm/m  RA Area:     14.30 cm LA Vol (A2C):   61.0 ml 34.93 ml/m RA Volume:   32.10 ml  18.38 ml/m LA Vol (A4C):   67.5 ml 38.65 ml/m LA Biplane Vol: 66.8 ml 38.25 ml/m  AORTIC VALVE AV Area (Vmax):    2.66 cm AV Area (Vmean):   2.33 cm AV Area (VTI):     2.26 cm AV Vmax:           160.17 cm/s AV Vmean:          111.761 cm/s AV VTI:  0.342 m AV Peak Grad:      10.3 mmHg AV Mean Grad:      5.8 mmHg LVOT Vmax:         123.22 cm/s LVOT Vmean:        75.073 cm/s LVOT VTI:          0.223 m LVOT/AV VTI ratio: 0.65 AI PHT:            368 msec  AORTA Ao Root diam: 3.00 cm Ao  Asc diam:  3.60 cm MITRAL VALVE MV Area (PHT): 6.32 cm     SHUNTS MV Decel Time: 120 msec     Systemic VTI:  0.22 m MV E velocity: 74.70 cm/s   Systemic Diam: 2.10 cm MV A velocity: 135.00 cm/s MV E/A ratio:  0.55 Dina Rich MD Electronically signed by Dina Rich MD Signature Date/Time: 06/26/2020/3:52:39 PM    Final     Microbiology: Recent Results (from the past 240 hour(s))  Respiratory Panel by RT PCR (Flu A&B, Covid) - Nasopharyngeal Swab     Status: None   Collection Time: 06/25/20 11:31 AM   Specimen: Nasopharyngeal Swab  Result Value Ref Range Status   SARS Coronavirus 2 by RT PCR NEGATIVE NEGATIVE Final    Comment: (NOTE) SARS-CoV-2 target nucleic acids are NOT DETECTED.  The SARS-CoV-2 RNA is generally detectable in upper respiratoy specimens during the acute phase of infection. The lowest concentration of SARS-CoV-2 viral copies this assay can detect is 131 copies/mL. A negative result does not preclude SARS-Cov-2 infection and should not be used as the sole basis for treatment or other patient management decisions. A negative result may occur with  improper specimen collection/handling, submission of specimen other than nasopharyngeal swab, presence of viral mutation(s) within the areas targeted by this assay, and inadequate number of viral copies (<131 copies/mL). A negative result must be combined with clinical observations, patient history, and epidemiological information. The expected result is Negative.  Fact Sheet for Patients:  https://www.moore.com/  Fact Sheet for Healthcare Providers:  https://www.young.biz/  This test is no t yet approved or cleared by the [place] FDA and  has been authorized for detection and/or diagnosis of SARS-CoV-2 by FDA under an Emergency Use Authorization (EUA). This EUA will remain  in effect (meaning this test can be used) for the duration of the COVID-19 declaration under  Section 564(b)(1) of the Act, 21 U.S.C. section 360bbb-3(b)(1), unless the authorization is terminated or revoked sooner.     Influenza A by PCR NEGATIVE NEGATIVE Final   Influenza B by PCR NEGATIVE NEGATIVE Final    Comment: (NOTE) The Xpert Xpress SARS-CoV-2/FLU/RSV assay is intended as an aid in  the diagnosis of influenza from Nasopharyngeal swab specimens and  should not be used as a sole basis for treatment. Nasal washings and  aspirates are unacceptable for Xpert Xpress SARS-CoV-2/FLU/RSV  testing.  Fact Sheet for Patients: https://www.moore.com/  Fact Sheet for Healthcare Providers: https://www.young.biz/  This test is not yet approved or cleared by the [place] FDA and  has been authorized for detection and/or diagnosis of SARS-CoV-2 by  FDA under an Emergency Use Authorization (EUA). This EUA will remain  in effect (meaning this test can be used) for the duration of the  Covid-19 declaration under Section 564(b)(1) of the Act, 21  U.S.C. section 360bbb-3(b)(1), unless the authorization is  terminated or revoked. Performed at [facility], [address] [place], [place] [address]   Culture, blood (routine x 2)  Status: None (Preliminary result)   Collection Time: 06/25/20 11:36 AM   Specimen: Left Antecubital; Blood  Result Value Ref Range Status   Specimen Description   Final    LEFT ANTECUBITAL BOTTLES DRAWN AEROBIC AND ANAEROBIC   Special Requests Blood Culture adequate volume  Final   Culture   Final    NO GROWTH 3 DAYS Performed at [facility], [address] [place], [place] [address]    Report Status PENDING  Incomplete  Culture, blood (routine x 2)     Status: None (Preliminary result)   Collection Time: 06/25/20 12:18 PM   Specimen: BLOOD LEFT ARM  Result Value Ref Range Status   Specimen Description BLOOD LEFT ARM BOTTLES DRAWN AEROBIC AND ANAEROBIC  Final   Special Requests Blood Culture adequate volume   Final   Culture   Final    NO GROWTH 3 DAYS Performed at [facility], [address] [place], [place] [address]    Report Status PENDING  Incomplete  Culture, Urine     Status: Abnormal   Collection Time: 06/25/20  6:00 PM   Specimen: Urine, Clean Catch  Result Value Ref Range Status   Specimen Description   Final    URINE, CLEAN CATCH Performed at [facility], [address] [place], [place] [address]    Special Requests   Final    NONE Performed at [facility], [address] [place], [place] [address]    Culture MULTIPLE SPECIES PRESENT, SUGGEST RECOLLECTION (A)  Final   Report Status 06/28/2020 FINAL  Final     Labs: Basic Metabolic Panel: Recent Labs  Lab 06/25/20 1203 06/26/20 0433 06/27/20 1845 06/28/20 0705  NA 139 142 138 139  K 3.2* 4.0 3.0* 3.3*  CL 98 101 91* 93*  CO2 31 31 27  38*  GLUCOSE 112* 134* 249* 124*  BUN 23 24* 32* 39*  CREATININE 0.68 0.67 1.03* 0.78  CALCIUM 10.0 9.7 10.0 9.6   Liver Function Tests: Recent Labs  Lab 06/25/20 1203 06/26/20 0433 06/27/20 1845  AST 32 28 40  ALT 18 16 <5  ALKPHOS 42 36* 43  BILITOT 0.9 0.8 0.7  PROT 7.1 6.6 7.2  ALBUMIN 3.8 3.3* 3.5   No results for input(s): LIPASE, AMYLASE in the last 168 hours. No results for input(s): AMMONIA in the last 168 hours. CBC: Recent Labs  Lab 06/25/20 1203 06/26/20 0433 06/27/20 1845 06/28/20 0705  WBC 8.3 5.7 15.3* 12.6*  NEUTROABS 5.8  --   --  8.4*  HGB 11.8* 11.0* 13.4 12.2  HCT 38.5 36.2 43.5 39.4  MCV 93.0 95.0 92.2 93.8  PLT 171 167 295 202   Cardiac Enzymes: No results for input(s): CKTOTAL, CKMB, CKMBINDEX, TROPONINI in the last 168 hours. BNP: BNP (last 3 results) Recent Labs    06/25/20 1203  BNP 365.0*    ProBNP (last 3 results) No results for input(s): PROBNP in the last 8760 hours.  CBG: Recent Labs  Lab 06/27/20 1831  GLUCAP 230*       Signed:  Meredeth IdeGagan S Dailynn Nancarrow MD.  [facility] 06/28/2020, 4:28 PM

## 2020-06-29 ENCOUNTER — Ambulatory Visit (HOSPITAL_COMMUNITY): Payer: PPO | Admitting: Occupational Therapy

## 2020-06-29 NOTE — Progress Notes (Signed)
[facility]  PROGRESS NOTE  [name] QJJ:[id number] DOB: 08/26/1931 DOA: 06/28/2020 PCP: Benita Stabile, MD   Brief HPI:   [name]  is a 86 y.o. female, with history of COPD, hyperlipidemia, hypertension, hypothyroidism who presented with shortness of breath.  Chest x-ray showed patchy consolidation left lower lobe, patient was started on IV antibiotics however patient condition worsened and was requiring 10 L of oxygen via nasal cannula.  Patient continued to be confused and agitated, and was made DNR.  After discussion with family patient was made full comfort care and admitted to hospice    Subjective   Patient continues to be unresponsive, breathing comfortably   Assessment/Plan:     1. Acute hypoxemic respiratory failure-secondary to pneumonia, underlying COPD.  Patient made full comfort care.  Continue oxygen for comfort.  Patient on morphine as needed for dyspnea, pain and agitation.     COVID-19 Labs  No results for input(s): DDIMER, FERRITIN, LDH, CRP in the last 72 hours.  Lab Results  Component Value Date   SARSCOV2NAA NEGATIVE 06/25/2020     Scheduled medications:         CBG: Recent Labs  Lab 06/27/20 1831  GLUCAP 230*         CBC: Recent Labs  Lab 06/25/20 1203 06/26/20 0433 06/27/20 1845 06/28/20 0705  WBC 8.3 5.7 15.3* 12.6*  NEUTROABS 5.8  --   --  8.4*  HGB 11.8* 11.0* 13.4 12.2  HCT 38.5 36.2 43.5 39.4  MCV 93.0 95.0 92.2 93.8  PLT 171 167 295 202    Basic Metabolic Panel: Recent Labs  Lab 06/25/20 1203 06/26/20 0433 06/27/20 1845 06/28/20 0705  NA 139 142 138 139  K 3.2* 4.0 3.0* 3.3*  CL 98 101 91* 93*  CO2 31 31 27  38*  GLUCOSE 112* 134* 249* 124*  BUN 23 24* 32* 39*  CREATININE 0.68 0.67 1.03* 0.78  CALCIUM 10.0 9.7 10.0 9.6     Liver Function Tests: Recent Labs  Lab 06/25/20 1203 06/26/20 0433 06/27/20 1845  AST 32 28 40  ALT 18 16 <5  ALKPHOS 42 36* 43  BILITOT 0.9 0.8 0.7   PROT 7.1 6.6 7.2  ALBUMIN 3.8 3.3* 3.5     Antibiotics: Anti-infectives (From admission, onward)   None       DVT prophylaxis: None, patient is comfort care  Code Status: DNR  Family Communication: Discussed with daughter at bedside    Status is: Inpatient  Dispo: The patient is from: Home              Anticipated d/c is to: Residential hospice              Anticipated d/c date is: 2 to 3 days              Patient currently awaiting bed at residential hospice  Barrier to discharge-awaiting bed at residential hospice      Consultants:    Procedures:     Objective   There were no vitals filed for this visit. No intake or output data in the 24 hours ending 06/29/20 1607  No intake/output data recorded.  There were no vitals filed for this visit.  Physical Examination:    General: Appears comfortable  Cardiovascular: S1-S2, regular  Respiratory: Mild tachypnea, lungs are clear to auscultation bilaterally  Abdomen: Abdomen is soft, nontender, no organomegaly  Extremities: No edema in the lower extremities  Neurologic: Obtunded    Data Reviewed:  Recent Results (from the past 240 hour(s))  Respiratory Panel by RT PCR (Flu A&B, Covid) - Nasopharyngeal Swab     Status: None   Collection Time: 06/25/20 11:31 AM   Specimen: Nasopharyngeal Swab  Result Value Ref Range Status   SARS Coronavirus 2 by RT PCR NEGATIVE NEGATIVE Final    Comment: (NOTE) SARS-CoV-2 target nucleic acids are NOT DETECTED.  The SARS-CoV-2 RNA is generally detectable in upper respiratoy specimens during the acute phase of infection. The lowest concentration of SARS-CoV-2 viral copies this assay can detect is 131 copies/mL. A negative result does not preclude SARS-Cov-2 infection and should not be used as the sole basis for treatment or other patient management decisions. A negative result may occur with  improper specimen collection/handling, submission of specimen  other than nasopharyngeal swab, presence of viral mutation(s) within the areas targeted by this assay, and inadequate number of viral copies (<131 copies/mL). A negative result must be combined with clinical observations, patient history, and epidemiological information. The expected result is Negative.  Fact Sheet for Patients:  https://www.moore.com/  Fact Sheet for Healthcare Providers:  https://www.young.biz/  This test is no t yet approved or cleared by the [place] FDA and  has been authorized for detection and/or diagnosis of SARS-CoV-2 by FDA under an Emergency Use Authorization (EUA). This EUA will remain  in effect (meaning this test can be used) for the duration of the COVID-19 declaration under Section 564(b)(1) of the Act, 21 U.S.C. section 360bbb-3(b)(1), unless the authorization is terminated or revoked sooner.     Influenza A by PCR NEGATIVE NEGATIVE Final   Influenza B by PCR NEGATIVE NEGATIVE Final    Comment: (NOTE) The Xpert Xpress SARS-CoV-2/FLU/RSV assay is intended as an aid in  the diagnosis of influenza from Nasopharyngeal swab specimens and  should not be used as a sole basis for treatment. Nasal washings and  aspirates are unacceptable for Xpert Xpress SARS-CoV-2/FLU/RSV  testing.  Fact Sheet for Patients: https://www.moore.com/  Fact Sheet for Healthcare Providers: https://www.young.biz/  This test is not yet approved or cleared by the [place] FDA and  has been authorized for detection and/or diagnosis of SARS-CoV-2 by  FDA under an Emergency Use Authorization (EUA). This EUA will remain  in effect (meaning this test can be used) for the duration of the  Covid-19 declaration under Section 564(b)(1) of the Act, 21  U.S.C. section 360bbb-3(b)(1), unless the authorization is  terminated or revoked. Performed at [facility], [address], [place], [place]  [address]   Culture, blood (routine x 2)     Status: None (Preliminary result)   Collection Time: 06/25/20 11:36 AM   Specimen: Left Antecubital; Blood  Result Value Ref Range Status   Specimen Description   Final    LEFT ANTECUBITAL BOTTLES DRAWN AEROBIC AND ANAEROBIC   Special Requests Blood Culture adequate volume  Final   Culture   Final    NO GROWTH 4 DAYS Performed at [facility], [address] [place], [place] [address]    Report Status PENDING  Incomplete  Culture, blood (routine x 2)     Status: None (Preliminary result)   Collection Time: 06/25/20 12:18 PM   Specimen: BLOOD LEFT ARM  Result Value Ref Range Status   Specimen Description BLOOD LEFT ARM BOTTLES DRAWN AEROBIC AND ANAEROBIC  Final   Special Requests Blood Culture adequate volume  Final   Culture   Final    NO GROWTH 4 DAYS Performed at [facility], [address], [place], [place] [address]    Report Status PENDING  Incomplete  Culture, Urine     Status: Abnormal   Collection Time: 06/25/20  6:00 PM   Specimen: Urine, Clean Catch  Result Value Ref Range Status   Specimen Description   Final    URINE, CLEAN CATCH Performed at [facility], [address], [place], [place] [address]    Special Requests   Final    NONE Performed at [facility], [address] [place], [place] [address]    Culture MULTIPLE SPECIES PRESENT, SUGGEST RECOLLECTION (A)  Final   Report Status 06/28/2020 FINAL  Final    No results for input(s): LIPASE, AMYLASE in the last 168 hours. No results for input(s): AMMONIA in the last 168 hours.  Cardiac Enzymes: No results for input(s): CKTOTAL, CKMB, CKMBINDEX, TROPONINI in the last 168 hours. BNP (last 3 results) Recent Labs    06/25/20 1203  BNP 365.0*    ProBNP (last 3 results) No results for input(s): PROBNP in the last 8760 hours.  Studies:  No results found.     Meredeth Ide   [facility] If 7PM-7AM, please contact night-coverage at  [email or web address], Office  [phone]   06/29/2020, 4:07 PM  LOS: 1 day

## 2020-06-29 NOTE — TOC Progression Note (Signed)
Transition of Care [facility]) - Progression Note    Patient Details  Name: ICEY TELLO MRN: [id number] Date of Birth: December 24, 1930  [facility] CM/SW Contact  Barry Brunner, LCSW Phone Number: 06/29/2020, 11:19 AM  Clinical Narrative:    CSW spoke with Cassandra with [facility] to verify bed availability. Cassandra reported that there are currently not any residential beds available for pending GIP patients. TOC to follow.         Expected Discharge Plan and Services                                                 Social Determinants of Health (SDOH) Interventions    Readmission Risk Interventions No flowsheet data found.

## 2020-06-30 DIAGNOSIS — J9601 Acute respiratory failure with hypoxia: Secondary | ICD-10-CM

## 2020-06-30 LAB — CULTURE, BLOOD (ROUTINE X 2)
Culture: NO GROWTH
Culture: NO GROWTH
Special Requests: ADEQUATE
Special Requests: ADEQUATE

## 2020-06-30 MED ORDER — NYSTATIN 100000 UNIT/ML MT SUSP
5.0000 mL | Freq: Four times a day (QID) | OROMUCOSAL | Status: AC
  Administered 2020-06-30 – 2020-07-02 (×7): 500000 [IU] via ORAL
  Filled 2020-06-30 (×6): qty 5

## 2020-06-30 MED ORDER — HYDRALAZINE HCL 20 MG/ML IJ SOLN
10.0000 mg | Freq: Four times a day (QID) | INTRAMUSCULAR | Status: DC | PRN
  Administered 2020-06-30: 10 mg via INTRAVENOUS
  Filled 2020-06-30: qty 1

## 2020-06-30 NOTE — Progress Notes (Signed)
[facility]  PROGRESS NOTE  ARSHI DUARTE KCL:[id number] DOB: [birth year] DOA: 06/28/2020 PCP: Benita Stabile, MD   Brief HPI:   [name]  is a 86 y.o. female, with history of COPD, hyperlipidemia, hypertension, hypothyroidism who presented with shortness of breath.  Chest x-ray showed patchy consolidation left lower lobe, patient was started on IV antibiotics however patient condition worsened and was requiring 10 L of oxygen via nasal cannula.  Patient continued to be confused and agitated, and was made DNR.  After discussion with family patient was made full comfort care and admitted to hospice    Subjective   Patient seen and examined, she is more alert today.  Denies any complaints   Assessment/Plan:     1. Acute hypoxemic respiratory failure-secondary to pneumonia, underlying COPD.  Patient made full comfort care.  Continue oxygen for comfort.  Patient on morphine as needed for dyspnea, pain and agitation.     COVID-19 Labs  No results for input(s): DDIMER, FERRITIN, LDH, CRP in the last 72 hours.  Lab Results  Component Value Date   SARSCOV2NAA NEGATIVE 06/25/2020     Scheduled medications:         CBG: Recent Labs  Lab 06/27/20 1831  GLUCAP 230*         CBC: Recent Labs  Lab 06/25/20 1203 06/26/20 0433 06/27/20 1845 06/28/20 0705  WBC 8.3 5.7 15.3* 12.6*  NEUTROABS 5.8  --   --  8.4*  HGB 11.8* 11.0* 13.4 12.2  HCT 38.5 36.2 43.5 39.4  MCV 93.0 95.0 92.2 93.8  PLT 171 167 295 202    Basic Metabolic Panel: Recent Labs  Lab 06/25/20 1203 06/26/20 0433 06/27/20 1845 06/28/20 0705  NA 139 142 138 139  K 3.2* 4.0 3.0* 3.3*  CL 98 101 91* 93*  CO2 31 31 27  38*  GLUCOSE 112* 134* 249* 124*  BUN 23 24* 32* 39*  CREATININE 0.68 0.67 1.03* 0.78  CALCIUM 10.0 9.7 10.0 9.6     Liver Function Tests: Recent Labs  Lab 06/25/20 1203 06/26/20 0433 06/27/20 1845  AST 32 28 40  ALT 18 16 <5  ALKPHOS 42 36* 43  BILITOT  0.9 0.8 0.7  PROT 7.1 6.6 7.2  ALBUMIN 3.8 3.3* 3.5     Antibiotics: Anti-infectives (From admission, onward)   None       DVT prophylaxis: None, patient is comfort care  Code Status: DNR  Family Communication: Discussed with daughter at bedside    Status is: Inpatient  Dispo: The patient is from: Home              Anticipated d/c is to: Residential hospice              Anticipated d/c date is: 2 to 3 days              Patient currently awaiting bed at residential hospice  Barrier to discharge-awaiting bed at residential hospice      Consultants:    Procedures:     Objective   There were no vitals filed for this visit.  Intake/Output Summary (Last 24 hours) at 06/30/2020 1214 Last data filed at 06/30/2020 0900 Gross per 24 hour  Intake 0 ml  Output 650 ml  Net -650 ml    No intake/output data recorded.  There were no vitals filed for this visit.  Physical Examination:  General-appears in no acute distress Heart-S1-S2, regular, no murmur auscultated Lungs-clear to auscultation bilaterally, no wheezing  or crackles auscultated Abdomen-soft, nontender, no organomegaly Extremities-no edema in the lower extremities Neuro-alert,    Data Reviewed:   Recent Results (from the past 240 hour(s))  Respiratory Panel by RT PCR (Flu A&B, Covid) - Nasopharyngeal Swab     Status: None   Collection Time: 06/25/20 11:31 AM   Specimen: Nasopharyngeal Swab  Result Value Ref Range Status   SARS Coronavirus 2 by RT PCR NEGATIVE NEGATIVE Final    Comment: (NOTE) SARS-CoV-2 target nucleic acids are NOT DETECTED.  The SARS-CoV-2 RNA is generally detectable in upper respiratoy specimens during the acute phase of infection. The lowest concentration of SARS-CoV-2 viral copies this assay can detect is 131 copies/mL. A negative result does not preclude SARS-Cov-2 infection and should not be used as the sole basis for treatment or other patient management decisions. A  negative result may occur with  improper specimen collection/handling, submission of specimen other than nasopharyngeal swab, presence of viral mutation(s) within the areas targeted by this assay, and inadequate number of viral copies (<131 copies/mL). A negative result must be combined with clinical observations, patient history, and epidemiological information. The expected result is Negative.  Fact Sheet for Patients:  https://www.moore.com/  Fact Sheet for Healthcare Providers:  https://www.young.biz/  This test is no t yet approved or cleared by the [place] FDA and  has been authorized for detection and/or diagnosis of SARS-CoV-2 by FDA under an Emergency Use Authorization (EUA). This EUA will remain  in effect (meaning this test can be used) for the duration of the COVID-19 declaration under Section 564(b)(1) of the Act, 21 U.S.C. section 360bbb-3(b)(1), unless the authorization is terminated or revoked sooner.     Influenza A by PCR NEGATIVE NEGATIVE Final   Influenza B by PCR NEGATIVE NEGATIVE Final    Comment: (NOTE) The Xpert Xpress SARS-CoV-2/FLU/RSV assay is intended as an aid in  the diagnosis of influenza from Nasopharyngeal swab specimens and  should not be used as a sole basis for treatment. Nasal washings and  aspirates are unacceptable for Xpert Xpress SARS-CoV-2/FLU/RSV  testing.  Fact Sheet for Patients: https://www.moore.com/  Fact Sheet for Healthcare Providers: https://www.young.biz/  This test is not yet approved or cleared by the [place] FDA and  has been authorized for detection and/or diagnosis of SARS-CoV-2 by  FDA under an Emergency Use Authorization (EUA). This EUA will remain  in effect (meaning this test can be used) for the duration of the  Covid-19 declaration under Section 564(b)(1) of the Act, 21  U.S.C. section 360bbb-3(b)(1), unless the authorization  is  terminated or revoked. Performed at [facility], [address], [place], [place] [address]   Culture, blood (routine x 2)     Status: None   Collection Time: 06/25/20 11:36 AM   Specimen: Left Antecubital; Blood  Result Value Ref Range Status   Specimen Description   Final    LEFT ANTECUBITAL BOTTLES DRAWN AEROBIC AND ANAEROBIC   Special Requests Blood Culture adequate volume  Final   Culture   Final    NO GROWTH 5 DAYS Performed at [facility], [address], [place], [place] [address]    Report Status 06/30/2020 FINAL  Final  Culture, blood (routine x 2)     Status: None   Collection Time: 06/25/20 12:18 PM   Specimen: BLOOD LEFT ARM  Result Value Ref Range Status   Specimen Description BLOOD LEFT ARM BOTTLES DRAWN AEROBIC AND ANAEROBIC  Final   Special Requests Blood Culture adequate volume  Final   Culture   Final    NO GROWTH 5 DAYS Performed at [facility], [address] [place], [place] [address]    Report Status 06/30/2020 FINAL  Final  Culture, Urine     Status: Abnormal   Collection Time: 06/25/20  6:00 PM   Specimen: Urine, Clean Catch  Result Value Ref Range Status   Specimen Description   Final    URINE, CLEAN CATCH Performed at [facility], [address] [place], [place] [address]    Special Requests   Final    NONE Performed at [facility], [address] [place], [place] [address]    Culture MULTIPLE SPECIES PRESENT, SUGGEST RECOLLECTION (A)  Final   Report Status 06/28/2020 FINAL  Final    No results for input(s): LIPASE, AMYLASE in the last 168 hours. No results for input(s): AMMONIA in the last 168 hours.  Cardiac Enzymes: No results for input(s): CKTOTAL, CKMB, CKMBINDEX, TROPONINI in the last 168 hours. BNP (last 3 results) Recent Labs    06/25/20 1203  BNP 365.0*        Ayanah Snader S Kaniesha Barile   [facility] If 7PM-7AM, please contact night-coverage at [email or web address], Office  [phone]   06/30/2020, 12:14 PM  LOS: 2 days

## 2020-06-30 NOTE — Progress Notes (Signed)
Upon entering room, patient took of oxygen and gown.   Oxygen placed back and gown put on patient. Bed exit alarmed and call bell within patient reach.   Mouth care completed and mouth swabs given.

## 2020-06-30 NOTE — Plan of Care (Signed)
  Problem: Education: Goal: Verbalization of understanding the information provided will improve Outcome: Progressing   Problem: Coping: Goal: Level of anxiety will decrease Outcome: Progressing Goal: Ability to identify and utilize appropriate coping strategies will improve Outcome: Progressing Goal: Ability to identify and utilize available resources and services will improve Outcome: Progressing Goal: Will verbalize feelings Outcome: Progressing   

## 2020-07-01 NOTE — Progress Notes (Signed)
[facility]  PROGRESS NOTE  [name] DVV:[id number] DOB: May 01, 1931 DOA: 06/28/2020 PCP: Benita Stabile, MD   Brief HPI:   [name]  is a 86 y.o. female, with history of COPD, hyperlipidemia, hypertension, hypothyroidism who presented with shortness of breath.  Chest x-ray showed patchy consolidation left lower lobe, patient was started on IV antibiotics however patient condition worsened and was requiring 10 L of oxygen via nasal cannula.  Patient continued to be confused and agitated, and was made DNR.  After discussion with family patient was made full comfort care and admitted to hospice    Subjective   Patient seen and examined, she is alert today.  Communicating   Assessment/Plan:     1. Acute hypoxemic respiratory failure-secondary to pneumonia, underlying COPD.  Patient made full comfort care.  Continue oxygen for comfort.  Patient on morphine as needed for dyspnea, pain and agitation.     COVID-19 Labs  No results for input(s): DDIMER, FERRITIN, LDH, CRP in the last 72 hours.  Lab Results  Component Value Date   SARSCOV2NAA NEGATIVE 06/25/2020     Scheduled medications:   . nystatin  5 mL Oral QID         CBG: Recent Labs  Lab 06/27/20 1831  GLUCAP 230*         CBC: Recent Labs  Lab 06/25/20 1203 06/26/20 0433 06/27/20 1845 06/28/20 0705  WBC 8.3 5.7 15.3* 12.6*  NEUTROABS 5.8  --   --  8.4*  HGB 11.8* 11.0* 13.4 12.2  HCT 38.5 36.2 43.5 39.4  MCV 93.0 95.0 92.2 93.8  PLT 171 167 295 202    Basic Metabolic Panel: Recent Labs  Lab 06/25/20 1203 06/26/20 0433 06/27/20 1845 06/28/20 0705  NA 139 142 138 139  K 3.2* 4.0 3.0* 3.3*  CL 98 101 91* 93*  CO2 31 31 27  38*  GLUCOSE 112* 134* 249* 124*  BUN 23 24* 32* 39*  CREATININE 0.68 0.67 1.03* 0.78  CALCIUM 10.0 9.7 10.0 9.6     Liver Function Tests: Recent Labs  Lab 06/25/20 1203 06/26/20 0433 06/27/20 1845  AST 32 28 40  ALT 18 16 <5  ALKPHOS 42  36* 43  BILITOT 0.9 0.8 0.7  PROT 7.1 6.6 7.2  ALBUMIN 3.8 3.3* 3.5     Antibiotics: Anti-infectives (From admission, onward)   None       DVT prophylaxis: None, patient is comfort care  Code Status: DNR  Family Communication: Discussed with daughter at bedside    Status is: Inpatient  Dispo: The patient is from: Home              Anticipated d/c is to: Residential hospice              Anticipated d/c date is: 2 to 3 days              Patient currently awaiting bed at residential hospice  Barrier to discharge-awaiting bed at residential hospice      Consultants:    Procedures:     Objective   Vitals:   07/01/20 0015  BP: (!) 158/62    Intake/Output Summary (Last 24 hours) at 07/01/2020 1501 Last data filed at 07/01/2020 08/31/2020 Gross per 24 hour  Intake --  Output 550 ml  Net -550 ml    09/30 1901 - 10/02 0700 In: 0  Out: 1200 [Urine:1200]  There were no vitals filed for this visit.  Physical Examination:  General-appears  in no acute distress Heart-S1-S2, regular, no murmur auscultated Lungs-clear to auscultation bilaterally, no wheezing or crackles auscultated Abdomen-soft, nontender, no organomegaly Extremities-no edema in the lower extremities Neuro-alert, oriented to self   Data Reviewed:   Recent Results (from the past 240 hour(s))  Respiratory Panel by RT PCR (Flu A&B, Covid) - Nasopharyngeal Swab     Status: None   Collection Time: 06/25/20 11:31 AM   Specimen: Nasopharyngeal Swab  Result Value Ref Range Status   SARS Coronavirus 2 by RT PCR NEGATIVE NEGATIVE Final    Comment: (NOTE) SARS-CoV-2 target nucleic acids are NOT DETECTED.  The SARS-CoV-2 RNA is generally detectable in upper respiratoy specimens during the acute phase of infection. The lowest concentration of SARS-CoV-2 viral copies this assay can detect is 131 copies/mL. A negative result does not preclude SARS-Cov-2 infection and should not be used as the sole  basis for treatment or other patient management decisions. A negative result may occur with  improper specimen collection/handling, submission of specimen other than nasopharyngeal swab, presence of viral mutation(s) within the areas targeted by this assay, and inadequate number of viral copies (<131 copies/mL). A negative result must be combined with clinical observations, patient history, and epidemiological information. The expected result is Negative.  Fact Sheet for Patients:  https://www.moore.com/  Fact Sheet for Healthcare Providers:  https://www.young.biz/  This test is no t yet approved or cleared by the [place] FDA and  has been authorized for detection and/or diagnosis of SARS-CoV-2 by FDA under an Emergency Use Authorization (EUA). This EUA will remain  in effect (meaning this test can be used) for the duration of the COVID-19 declaration under Section 564(b)(1) of the Act, 21 U.S.C. section 360bbb-3(b)(1), unless the authorization is terminated or revoked sooner.     Influenza A by PCR NEGATIVE NEGATIVE Final   Influenza B by PCR NEGATIVE NEGATIVE Final    Comment: (NOTE) The Xpert Xpress SARS-CoV-2/FLU/RSV assay is intended as an aid in  the diagnosis of influenza from Nasopharyngeal swab specimens and  should not be used as a sole basis for treatment. Nasal washings and  aspirates are unacceptable for Xpert Xpress SARS-CoV-2/FLU/RSV  testing.  Fact Sheet for Patients: https://www.moore.com/  Fact Sheet for Healthcare Providers: https://www.young.biz/  This test is not yet approved or cleared by the [place] FDA and  has been authorized for detection and/or diagnosis of SARS-CoV-2 by  FDA under an Emergency Use Authorization (EUA). This EUA will remain  in effect (meaning this test can be used) for the duration of the  Covid-19 declaration under Section 564(b)(1) of the  Act, 21  U.S.C. section 360bbb-3(b)(1), unless the authorization is  terminated or revoked. Performed at [facility], [address], [place], [place] [address]   Culture, blood (routine x 2)     Status: None   Collection Time: 06/25/20 11:36 AM   Specimen: Left Antecubital; Blood  Result Value Ref Range Status   Specimen Description   Final    LEFT ANTECUBITAL BOTTLES DRAWN AEROBIC AND ANAEROBIC   Special Requests Blood Culture adequate volume  Final   Culture   Final    NO GROWTH 5 DAYS Performed at [facility], [address] [place], [place] [address]    Report Status 06/30/2020 FINAL  Final  Culture, blood (routine x 2)     Status: None   Collection Time: 06/25/20 12:18 PM   Specimen: BLOOD LEFT ARM  Result Value Ref Range Status   Specimen Description BLOOD LEFT  ARM BOTTLES DRAWN AEROBIC AND ANAEROBIC  Final   Special Requests Blood Culture adequate volume  Final   Culture   Final    NO GROWTH 5 DAYS Performed at [facility], [address], [place], [place] [address]    Report Status 06/30/2020 FINAL  Final  Culture, Urine     Status: Abnormal   Collection Time: 06/25/20  6:00 PM   Specimen: Urine, Clean Catch  Result Value Ref Range Status   Specimen Description   Final    URINE, CLEAN CATCH Performed at [facility], [address], [place], [place] [address]    Special Requests   Final    NONE Performed at [facility], [address] [place], [place] [address]    Culture MULTIPLE SPECIES PRESENT, SUGGEST RECOLLECTION (A)  Final   Report Status 06/28/2020 FINAL  Final    No results for input(s): LIPASE, AMYLASE in the last 168 hours. No results for input(s): AMMONIA in the last 168 hours.  Cardiac Enzymes: No results for input(s): CKTOTAL, CKMB, CKMBINDEX, TROPONINI in the last 168 hours. BNP (last 3 results) Recent Labs    06/25/20 1203  BNP 365.0*        Nell Schrack S Eliah Ozawa   [facility] If 7PM-7AM, please contact night-coverage at  [email or web address], Office  [phone]   07/01/2020, 3:01 PM  LOS: 3 days

## 2020-07-02 LAB — RESPIRATORY PANEL BY RT PCR (FLU A&B, COVID)
Influenza A by PCR: NEGATIVE
Influenza B by PCR: NEGATIVE
SARS Coronavirus 2 by RT PCR: NEGATIVE

## 2020-07-02 MED ORDER — GLYCOPYRROLATE 1 MG PO TABS: 1.0000 mg | ORAL_TABLET | ORAL | 0 refills | Status: AC | PRN

## 2020-07-02 MED ORDER — MORPHINE SULFATE (CONCENTRATE) 10 MG/0.5ML PO SOLN
5.0000 mg | ORAL | 0 refills | Status: AC | PRN
Start: 2020-07-02 — End: ?

## 2020-07-02 NOTE — Plan of Care (Signed)
  Problem: Coping: Goal: Level of anxiety will decrease Outcome: Progressing   Problem: Coping: Goal: Ability to identify and utilize appropriate coping strategies will improve Outcome: Progressing   Problem: Coping: Goal: Ability to identify and utilize appropriate coping strategies will improve Outcome: Progressing

## 2020-07-02 NOTE — Discharge Summary (Addendum)
Physician Discharge Summary  [name] CWC:[id number] DOB: 02-01-1931 DOA: 06/28/2020  PCP: Benita Stabile, MD  Admit date: 06/28/2020 Discharge date: 07/02/2020  Time spent: 50* minutes  Recommendations for Outpatient Follow-up:  1. Patient to be discharged to residential hospice today  Discharge Diagnoses:  Active Problems:   CAP (community acquired pneumonia)   Acute hypoxemic respiratory failure (HCC)   Discharge Condition: Stable  Diet recommendation: Comfort diet  There were no vitals filed for this visit.  History of present illness:  [name] a86 y.o.female,with history of COPD, hyperlipidemia, hypertension, hypothyroidism who presented with shortness of breath. Chest x-ray showed patchy consolidation left lower lobe, patient was started on IV antibiotics however patient condition worsened and was requiring 10 L of oxygen via nasal cannula. Patient continuedto be confused and agitated,and was made DNR. After discussion with family patient was made full comfort care and admitted to hospice   Hospital Course:  1. *Acute hypoxemic respiratory failure-secondary to pneumonia, underlying COPD.  Patient made full comfort care.  Continue oxygen for comfort.  Patient on morphine as needed for dyspnea, pain and agitation.    Procedures:    Consultations:  Discharge Exam: Vitals:   07/01/20 0015  BP: (!) 158/62    General: Appears in no acute distress Cardiovascular: S1-S2, regular Respiratory: Clear to auscultation bilaterally  Discharge Instructions   Discharge Instructions    Diet - low sodium heart healthy   Complete by: As directed    Increase activity slowly   Complete by: As directed      Allergies as of 07/02/2020   No Known Allergies     Medication List    STOP taking these medications   acetaminophen 500 MG tablet Commonly known as: TYLENOL     TAKE these medications   antiseptic oral rinse Liqd Apply 15 mLs  topically as needed for dry mouth.   atropine 1 % ophthalmic solution Place 4 drops under the tongue every 4 (four) hours as needed (excessive secretions).   glycopyrrolate 1 MG tablet Commonly known as: ROBINUL Take 1 tablet (1 mg total) by mouth every 4 (four) hours as needed (excessive secretions).   ipratropium-albuterol 0.5-2.5 (3) MG/3ML Soln Commonly known as: DUONEB Take 3 mLs by nebulization every 6 (six) hours as needed.   morphine CONCENTRATE 10 MG/0.5ML Soln concentrated solution Take 0.25 mLs (5 mg total) by mouth every 2 (two) hours as needed for moderate pain (or dyspnea).   polyvinyl alcohol 1.4 % ophthalmic solution Commonly known as: LIQUIFILM TEARS Place 1 drop into both eyes 4 (four) times daily as needed for dry eyes.      No Known Allergies    The results of significant diagnostics from this hospitalization (including imaging, microbiology, ancillary and laboratory) are listed below for reference.    Significant Diagnostic Studies: DG Chest Port 1 View  Result Date: 06/25/2020 CLINICAL DATA:  Patient with decreased oxygen saturation.  Cough. EXAM: PORTABLE CHEST 1 VIEW COMPARISON:  Chest radiograph 07/31/2013. FINDINGS: Monitoring leads overlie the patient. Stable cardiomegaly. Patchy consolidative opacity within the left lower lobe. No pleural effusion or pneumothorax. Incompletely visualized proximal right humerus fracture. Thoracic spine degenerative changes. IMPRESSION: 1. Patchy consolidation left lower lobe may represent atelectasis or infection. Followup PA and lateral chest X-ray is recommended in 3-4 weeks following trial of antibiotic therapy to ensure resolution and exclude underlying malignancy. 2. Redemonstrated proximal right humerus fracture. Electronically Signed   By: Annia Belt M.D.   On: 06/25/2020 12:48  ECHOCARDIOGRAM COMPLETE  Result Date: 06/26/2020    ECHOCARDIOGRAM REPORT   Patient Name:   Lauren PerfectMARGARET E Cabrera Date of Exam: 06/26/2020  Medical Rec #:  [id number]          Height:       63.0 in Accession #:    [id number]         Weight:       157.4 lb Date of Birth:  July 03, 1931         BSA:          1.747 m Patient Age:    86 years           BP:           186/75 mmHg Patient Gender: F                  HR:           84 bpm. [facility] Procedure: 2D Echo and Strain Analysis Indications:    CHF-Acute Diastolic 428.31 / I50.31  History:        Patient has no prior history of Echocardiogram examinations.                 COPD; Risk Factors:Former Smoker, Hypertension and Dyslipidemia.  Sonographer:    Leta Junglingiffany Cooper RDCS Referring Phys: [id number] Ervin Hensley S Mekhai Venuto IMPRESSIONS  1. Left ventricular ejection fraction, by estimation, is 55 to 60%. The left ventricle has normal function. The left ventricle has no regional wall motion abnormalities. There is mild left ventricular hypertrophy. Left ventricular diastolic parameters are consistent with Grade I diastolic dysfunction (impaired relaxation). Elevated left atrial pressure.  2. Right ventricular systolic function is normal. The right ventricular size is normal.  3. Left atrial size was moderately dilated.  4. The mitral valve is normal in structure. Trivial mitral valve regurgitation. No evidence of mitral stenosis.  5. The aortic valve is tricuspid. Aortic valve regurgitation is mild. No aortic stenosis is present.  6. The inferior vena cava is normal in size with greater than 50% respiratory variability, suggesting right atrial pressure of 3 mmHg. FINDINGS  Left Ventricle: Left ventricular ejection fraction, by estimation, is 55 to 60%. The left ventricle has normal function. The left ventricle has no regional wall motion abnormalities. The left ventricular internal cavity size was normal in size. There is  mild left ventricular hypertrophy. Left ventricular diastolic parameters are consistent with Grade I diastolic dysfunction (impaired relaxation). Elevated left atrial pressure. Right  Ventricle: The right ventricular size is normal. No increase in right ventricular wall thickness. Right ventricular systolic function is normal. Left Atrium: Left atrial size was moderately dilated. Right Atrium: Right atrial size was normal in size. Pericardium: There is no evidence of pericardial effusion. Mitral Valve: The mitral valve is normal in structure. Trivial mitral valve regurgitation. No evidence of mitral valve stenosis. Tricuspid Valve: The tricuspid valve is normal in structure. Tricuspid valve regurgitation is not demonstrated. No evidence of tricuspid stenosis. Aortic Valve: The aortic valve is tricuspid. Aortic valve regurgitation is mild. Aortic regurgitation PHT measures 368 msec. No aortic stenosis is present. Aortic valve mean gradient measures 5.8 mmHg. Aortic valve peak gradient measures 10.3 mmHg. Aortic valve area, by VTI measures 2.26 cm. Pulmonic Valve: The pulmonic valve was not well visualized. Pulmonic valve regurgitation is not visualized. No evidence of pulmonic stenosis. Aorta: The aortic root is normal in size and structure. Pulmonary Artery: Indeterminant PASP, inadequate TR jet. Venous: The inferior vena cava  is normal in size with greater than 50% respiratory variability, suggesting right atrial pressure of 3 mmHg. IAS/Shunts: No atrial level shunt detected by color flow Doppler.  LEFT VENTRICLE PLAX 2D LVIDd:         4.01 cm  Diastology LVIDs:         2.90 cm  LV e' medial:    4.13 cm/s LV PW:         1.21 cm  LV E/e' medial:  18.1 LV IVS:        1.36 cm  LV e' lateral:   5.33 cm/s LVOT diam:     2.10 cm  LV E/e' lateral: 14.0 LV SV:         77 LV SV Index:   44 LVOT Area:     3.46 cm  RIGHT VENTRICLE RV S prime:     15.10 cm/s TAPSE (M-mode): 2.1 cm LEFT ATRIUM             Index       RIGHT ATRIUM           Index LA diam:        4.20 cm 2.40 cm/m  RA Area:     14.30 cm LA Vol (A2C):   61.0 ml 34.93 ml/m RA Volume:   32.10 ml  18.38 ml/m LA Vol (A4C):   67.5 ml 38.65  ml/m LA Biplane Vol: 66.8 ml 38.25 ml/m  AORTIC VALVE AV Area (Vmax):    2.66 cm AV Area (Vmean):   2.33 cm AV Area (VTI):     2.26 cm AV Vmax:           160.17 cm/s AV Vmean:          111.761 cm/s AV VTI:            0.342 m AV Peak Grad:      10.3 mmHg AV Mean Grad:      5.8 mmHg LVOT Vmax:         123.22 cm/s LVOT Vmean:        75.073 cm/s LVOT VTI:          0.223 m LVOT/AV VTI ratio: 0.65 AI PHT:            368 msec  AORTA Ao Root diam: 3.00 cm Ao Asc diam:  3.60 cm MITRAL VALVE MV Area (PHT): 6.32 cm     SHUNTS MV Decel Time: 120 msec     Systemic VTI:  0.22 m MV E velocity: 74.70 cm/s   Systemic Diam: 2.10 cm MV A velocity: 135.00 cm/s MV E/A ratio:  0.55 Dina Rich MD Electronically signed by Dina Rich MD Signature Date/Time: 06/26/2020/3:52:39 PM    Final     Microbiology: Recent Results (from the past 240 hour(s))  Respiratory Panel by RT PCR (Flu A&B, Covid) - Nasopharyngeal Swab     Status: None   Collection Time: 06/25/20 11:31 AM   Specimen: Nasopharyngeal Swab  Result Value Ref Range Status   SARS Coronavirus 2 by RT PCR NEGATIVE NEGATIVE Final    Comment: (NOTE) SARS-CoV-2 target nucleic acids are NOT DETECTED.  The SARS-CoV-2 RNA is generally detectable in upper respiratoy specimens during the acute phase of infection. The lowest concentration of SARS-CoV-2 viral copies this assay can detect is 131 copies/mL. A negative result does not preclude SARS-Cov-2 infection and should not be used as the sole basis for treatment or other patient management decisions. A negative result may occur with  improper specimen collection/handling, submission of specimen other than nasopharyngeal swab, presence of viral mutation(s) within the areas targeted by this assay, and inadequate number of viral copies (<131 copies/mL). A negative result must be combined with clinical observations, patient history, and epidemiological information. The expected result is Negative.  Fact  Sheet for Patients:  https://www.moore.com/  Fact Sheet for Healthcare Providers:  https://www.young.biz/  This test is no t yet approved or cleared by the [place] FDA and  has been authorized for detection and/or diagnosis of SARS-CoV-2 by FDA under an Emergency Use Authorization (EUA). This EUA will remain  in effect (meaning this test can be used) for the duration of the COVID-19 declaration under Section 564(b)(1) of the Act, 21 U.S.C. section 360bbb-3(b)(1), unless the authorization is terminated or revoked sooner.     Influenza A by PCR NEGATIVE NEGATIVE Final   Influenza B by PCR NEGATIVE NEGATIVE Final    Comment: (NOTE) The Xpert Xpress SARS-CoV-2/FLU/RSV assay is intended as an aid in  the diagnosis of influenza from Nasopharyngeal swab specimens and  should not be used as a sole basis for treatment. Nasal washings and  aspirates are unacceptable for Xpert Xpress SARS-CoV-2/FLU/RSV  testing.  Fact Sheet for Patients: https://www.moore.com/  Fact Sheet for Healthcare Providers: https://www.young.biz/  This test is not yet approved or cleared by the [place] FDA and  has been authorized for detection and/or diagnosis of SARS-CoV-2 by  FDA under an Emergency Use Authorization (EUA). This EUA will remain  in effect (meaning this test can be used) for the duration of the  Covid-19 declaration under Section 564(b)(1) of the Act, 21  U.S.C. section 360bbb-3(b)(1), unless the authorization is  terminated or revoked. Performed at [facility], [address] [place], [place] [address]   Culture, blood (routine x 2)     Status: None   Collection Time: 06/25/20 11:36 AM   Specimen: Left Antecubital; Blood  Result Value Ref Range Status   Specimen Description   Final    LEFT ANTECUBITAL BOTTLES DRAWN AEROBIC AND ANAEROBIC   Special Requests Blood Culture adequate volume  Final   Culture    Final    NO GROWTH 5 DAYS Performed at [facility], [address] [place], [place] [address]    Report Status 06/30/2020 FINAL  Final  Culture, blood (routine x 2)     Status: None   Collection Time: 06/25/20 12:18 PM   Specimen: BLOOD LEFT ARM  Result Value Ref Range Status   Specimen Description BLOOD LEFT ARM BOTTLES DRAWN AEROBIC AND ANAEROBIC  Final   Special Requests Blood Culture adequate volume  Final   Culture   Final    NO GROWTH 5 DAYS Performed at [facility], [address], [place], [place] [address]    Report Status 06/30/2020 FINAL  Final  Culture, Urine     Status: Abnormal   Collection Time: 06/25/20  6:00 PM   Specimen: Urine, Clean Catch  Result Value Ref Range Status   Specimen Description   Final    URINE, CLEAN CATCH Performed at [facility] [place] [facility], [address] [place], [place] [address]    Special Requests   Final    NONE Performed at [facility], [address] [place], [place] [address]    Culture MULTIPLE SPECIES PRESENT, SUGGEST RECOLLECTION (A)  Final   Report Status 06/28/2020 FINAL  Final     Labs: Basic Metabolic Panel: Recent Labs  Lab 06/25/20 1203 06/26/20 0433 06/27/20 1845 06/28/20  0705  NA 139 142 138 139  K 3.2* 4.0 3.0* 3.3*  CL 98 101 91* 93*  CO2 31 31 27  38*  GLUCOSE 112* 134* 249* 124*  BUN 23 24* 32* 39*  CREATININE 0.68 0.67 1.03* 0.78  CALCIUM 10.0 9.7 10.0 9.6   Liver Function Tests: Recent Labs  Lab 06/25/20 1203 06/26/20 0433 06/27/20 1845  AST 32 28 40  ALT 18 16 <5  ALKPHOS 42 36* 43  BILITOT 0.9 0.8 0.7  PROT 7.1 6.6 7.2  ALBUMIN 3.8 3.3* 3.5   No results for input(s): LIPASE, AMYLASE in the last 168 hours. No results for input(s): AMMONIA in the last 168 hours. CBC: Recent Labs  Lab 06/25/20 1203 06/26/20 0433 06/27/20 1845 06/28/20 0705  WBC 8.3 5.7 15.3* 12.6*  NEUTROABS 5.8  --   --  8.4*  HGB 11.8* 11.0* 13.4 12.2  HCT 38.5 36.2 43.5 39.4  MCV 93.0 95.0 92.2 93.8  PLT 171 167 295 202    Cardiac Enzymes: No results for input(s): CKTOTAL, CKMB, CKMBINDEX, TROPONINI in the last 168 hours. BNP: BNP (last 3 results) Recent Labs    06/25/20 1203  BNP 365.0*    ProBNP (last 3 results) No results for input(s): PROBNP in the last 8760 hours.  CBG: Recent Labs  Lab 06/27/20 1831  GLUCAP 230*       Signed:  06/29/20 MD.  [facility] 07/02/2020, 12:00 PM

## 2020-07-04 ENCOUNTER — Encounter (HOSPITAL_COMMUNITY): Payer: PPO

## 2020-07-06 ENCOUNTER — Encounter (HOSPITAL_COMMUNITY): Payer: PPO

## 2020-07-11 ENCOUNTER — Encounter (HOSPITAL_COMMUNITY): Payer: PPO

## 2020-07-13 ENCOUNTER — Encounter (HOSPITAL_COMMUNITY): Payer: PPO | Admitting: Occupational Therapy

## 2020-07-18 ENCOUNTER — Encounter (HOSPITAL_COMMUNITY): Payer: PPO

## 2020-07-19 DIAGNOSIS — J441 Chronic obstructive pulmonary disease with (acute) exacerbation: Secondary | ICD-10-CM | POA: Diagnosis not present

## 2020-07-19 DIAGNOSIS — S42414D Nondisplaced simple supracondylar fracture without intercondylar fracture of right humerus, subsequent encounter for fracture with routine healing: Secondary | ICD-10-CM | POA: Diagnosis not present

## 2020-07-19 DIAGNOSIS — I10 Essential (primary) hypertension: Secondary | ICD-10-CM | POA: Diagnosis not present

## 2020-07-19 DIAGNOSIS — R600 Localized edema: Secondary | ICD-10-CM | POA: Diagnosis not present

## 2020-07-19 DIAGNOSIS — J189 Pneumonia, unspecified organism: Secondary | ICD-10-CM | POA: Diagnosis not present

## 2020-07-19 DIAGNOSIS — E039 Hypothyroidism, unspecified: Secondary | ICD-10-CM | POA: Diagnosis not present

## 2020-07-19 DIAGNOSIS — J9601 Acute respiratory failure with hypoxia: Secondary | ICD-10-CM | POA: Diagnosis not present

## 2020-07-20 ENCOUNTER — Encounter (HOSPITAL_COMMUNITY): Payer: PPO | Admitting: Occupational Therapy

## 2020-07-24 ENCOUNTER — Ambulatory Visit: Payer: PPO | Admitting: Orthopedic Surgery

## 2020-07-24 ENCOUNTER — Telehealth: Payer: Self-pay | Admitting: Orthopedic Surgery

## 2020-07-24 NOTE — Telephone Encounter (Signed)
Elwin Mocha daugher called to cancel appt and will not be R/S APPT at this time. She is in Hospice care at home right now, and the patient does not want to get out right now

## 2020-07-25 ENCOUNTER — Encounter (HOSPITAL_COMMUNITY): Payer: PPO | Admitting: Occupational Therapy

## 2020-07-27 ENCOUNTER — Encounter (HOSPITAL_COMMUNITY): Payer: PPO | Admitting: Occupational Therapy

## 2020-07-27 ENCOUNTER — Ambulatory Visit: Payer: PPO | Admitting: Orthopedic Surgery

## 2020-08-01 ENCOUNTER — Encounter (HOSPITAL_COMMUNITY): Payer: PPO | Admitting: Occupational Therapy

## 2020-08-03 ENCOUNTER — Encounter (HOSPITAL_COMMUNITY): Payer: PPO | Admitting: Occupational Therapy

## 2020-08-08 ENCOUNTER — Encounter (HOSPITAL_COMMUNITY): Payer: PPO

## 2020-08-08 DIAGNOSIS — J441 Chronic obstructive pulmonary disease with (acute) exacerbation: Secondary | ICD-10-CM | POA: Diagnosis not present

## 2020-08-08 DIAGNOSIS — R600 Localized edema: Secondary | ICD-10-CM | POA: Diagnosis not present

## 2020-08-08 DIAGNOSIS — I10 Essential (primary) hypertension: Secondary | ICD-10-CM | POA: Diagnosis not present

## 2020-08-08 DIAGNOSIS — J189 Pneumonia, unspecified organism: Secondary | ICD-10-CM | POA: Diagnosis not present

## 2020-08-08 DIAGNOSIS — J9601 Acute respiratory failure with hypoxia: Secondary | ICD-10-CM | POA: Diagnosis not present

## 2020-08-08 DIAGNOSIS — E039 Hypothyroidism, unspecified: Secondary | ICD-10-CM | POA: Diagnosis not present

## 2020-08-08 DIAGNOSIS — S42414D Nondisplaced simple supracondylar fracture without intercondylar fracture of right humerus, subsequent encounter for fracture with routine healing: Secondary | ICD-10-CM | POA: Diagnosis not present

## 2020-08-10 ENCOUNTER — Encounter (HOSPITAL_COMMUNITY): Payer: PPO | Admitting: Occupational Therapy

## 2020-08-15 ENCOUNTER — Encounter (HOSPITAL_COMMUNITY): Payer: PPO

## 2020-08-17 ENCOUNTER — Encounter (HOSPITAL_COMMUNITY): Payer: PPO | Admitting: Occupational Therapy

## 2020-08-28 DIAGNOSIS — J189 Pneumonia, unspecified organism: Secondary | ICD-10-CM | POA: Diagnosis not present

## 2020-08-28 DIAGNOSIS — K219 Gastro-esophageal reflux disease without esophagitis: Secondary | ICD-10-CM | POA: Diagnosis not present

## 2020-08-28 DIAGNOSIS — D509 Iron deficiency anemia, unspecified: Secondary | ICD-10-CM | POA: Diagnosis not present

## 2020-08-28 DIAGNOSIS — R944 Abnormal results of kidney function studies: Secondary | ICD-10-CM | POA: Diagnosis not present

## 2020-08-28 DIAGNOSIS — I129 Hypertensive chronic kidney disease with stage 1 through stage 4 chronic kidney disease, or unspecified chronic kidney disease: Secondary | ICD-10-CM | POA: Diagnosis not present

## 2020-08-28 DIAGNOSIS — J441 Chronic obstructive pulmonary disease with (acute) exacerbation: Secondary | ICD-10-CM | POA: Diagnosis not present

## 2020-08-28 DIAGNOSIS — Z712 Person consulting for explanation of examination or test findings: Secondary | ICD-10-CM | POA: Diagnosis not present

## 2020-08-28 DIAGNOSIS — E039 Hypothyroidism, unspecified: Secondary | ICD-10-CM | POA: Diagnosis not present

## 2020-08-28 DIAGNOSIS — J9601 Acute respiratory failure with hypoxia: Secondary | ICD-10-CM | POA: Diagnosis not present

## 2020-08-28 DIAGNOSIS — S42414D Nondisplaced simple supracondylar fracture without intercondylar fracture of right humerus, subsequent encounter for fracture with routine healing: Secondary | ICD-10-CM | POA: Diagnosis not present

## 2020-08-28 DIAGNOSIS — R600 Localized edema: Secondary | ICD-10-CM | POA: Diagnosis not present

## 2020-08-28 DIAGNOSIS — I10 Essential (primary) hypertension: Secondary | ICD-10-CM | POA: Diagnosis not present

## 2020-08-28 DIAGNOSIS — R7301 Impaired fasting glucose: Secondary | ICD-10-CM | POA: Diagnosis not present

## 2020-08-28 DIAGNOSIS — E782 Mixed hyperlipidemia: Secondary | ICD-10-CM | POA: Diagnosis not present

## 2020-08-28 DIAGNOSIS — J449 Chronic obstructive pulmonary disease, unspecified: Secondary | ICD-10-CM | POA: Diagnosis not present

## 2020-08-28 DIAGNOSIS — Z23 Encounter for immunization: Secondary | ICD-10-CM | POA: Diagnosis not present

## 2020-09-29 DIAGNOSIS — E039 Hypothyroidism, unspecified: Secondary | ICD-10-CM | POA: Diagnosis not present

## 2020-09-29 DIAGNOSIS — I10 Essential (primary) hypertension: Secondary | ICD-10-CM | POA: Diagnosis not present

## 2020-09-29 DIAGNOSIS — R600 Localized edema: Secondary | ICD-10-CM | POA: Diagnosis not present

## 2020-09-29 DIAGNOSIS — J189 Pneumonia, unspecified organism: Secondary | ICD-10-CM | POA: Diagnosis not present

## 2020-09-29 DIAGNOSIS — J9601 Acute respiratory failure with hypoxia: Secondary | ICD-10-CM | POA: Diagnosis not present

## 2020-09-29 DIAGNOSIS — S42414D Nondisplaced simple supracondylar fracture without intercondylar fracture of right humerus, subsequent encounter for fracture with routine healing: Secondary | ICD-10-CM | POA: Diagnosis not present

## 2020-09-29 DIAGNOSIS — J441 Chronic obstructive pulmonary disease with (acute) exacerbation: Secondary | ICD-10-CM | POA: Diagnosis not present

## 2020-09-29 DIAGNOSIS — Z23 Encounter for immunization: Secondary | ICD-10-CM | POA: Diagnosis not present

## 2020-11-30 DIAGNOSIS — R944 Abnormal results of kidney function studies: Secondary | ICD-10-CM | POA: Diagnosis not present

## 2020-11-30 DIAGNOSIS — J9601 Acute respiratory failure with hypoxia: Secondary | ICD-10-CM | POA: Diagnosis not present

## 2020-11-30 DIAGNOSIS — J189 Pneumonia, unspecified organism: Secondary | ICD-10-CM | POA: Diagnosis not present

## 2020-11-30 DIAGNOSIS — I129 Hypertensive chronic kidney disease with stage 1 through stage 4 chronic kidney disease, or unspecified chronic kidney disease: Secondary | ICD-10-CM | POA: Diagnosis not present

## 2020-11-30 DIAGNOSIS — Z23 Encounter for immunization: Secondary | ICD-10-CM | POA: Diagnosis not present

## 2020-11-30 DIAGNOSIS — J449 Chronic obstructive pulmonary disease, unspecified: Secondary | ICD-10-CM | POA: Diagnosis not present

## 2020-11-30 DIAGNOSIS — I10 Essential (primary) hypertension: Secondary | ICD-10-CM | POA: Diagnosis not present

## 2020-11-30 DIAGNOSIS — Z712 Person consulting for explanation of examination or test findings: Secondary | ICD-10-CM | POA: Diagnosis not present

## 2020-11-30 DIAGNOSIS — E782 Mixed hyperlipidemia: Secondary | ICD-10-CM | POA: Diagnosis not present

## 2020-11-30 DIAGNOSIS — K219 Gastro-esophageal reflux disease without esophagitis: Secondary | ICD-10-CM | POA: Diagnosis not present

## 2020-11-30 DIAGNOSIS — E039 Hypothyroidism, unspecified: Secondary | ICD-10-CM | POA: Diagnosis not present

## 2020-11-30 DIAGNOSIS — R7301 Impaired fasting glucose: Secondary | ICD-10-CM | POA: Diagnosis not present

## 2020-11-30 DIAGNOSIS — J441 Chronic obstructive pulmonary disease with (acute) exacerbation: Secondary | ICD-10-CM | POA: Diagnosis not present

## 2020-11-30 DIAGNOSIS — S42414D Nondisplaced simple supracondylar fracture without intercondylar fracture of right humerus, subsequent encounter for fracture with routine healing: Secondary | ICD-10-CM | POA: Diagnosis not present

## 2020-11-30 DIAGNOSIS — D509 Iron deficiency anemia, unspecified: Secondary | ICD-10-CM | POA: Diagnosis not present

## 2020-11-30 DIAGNOSIS — R600 Localized edema: Secondary | ICD-10-CM | POA: Diagnosis not present

## 2020-12-27 DIAGNOSIS — J441 Chronic obstructive pulmonary disease with (acute) exacerbation: Secondary | ICD-10-CM | POA: Diagnosis not present

## 2020-12-27 DIAGNOSIS — J189 Pneumonia, unspecified organism: Secondary | ICD-10-CM | POA: Diagnosis not present

## 2020-12-27 DIAGNOSIS — E039 Hypothyroidism, unspecified: Secondary | ICD-10-CM | POA: Diagnosis not present

## 2020-12-27 DIAGNOSIS — S42414D Nondisplaced simple supracondylar fracture without intercondylar fracture of right humerus, subsequent encounter for fracture with routine healing: Secondary | ICD-10-CM | POA: Diagnosis not present

## 2020-12-27 DIAGNOSIS — R600 Localized edema: Secondary | ICD-10-CM | POA: Diagnosis not present

## 2020-12-27 DIAGNOSIS — I10 Essential (primary) hypertension: Secondary | ICD-10-CM | POA: Diagnosis not present

## 2020-12-27 DIAGNOSIS — J9601 Acute respiratory failure with hypoxia: Secondary | ICD-10-CM | POA: Diagnosis not present

## 2021-01-11 DIAGNOSIS — H16223 Keratoconjunctivitis sicca, not specified as Sjogren's, bilateral: Secondary | ICD-10-CM | POA: Diagnosis not present

## 2021-02-08 IMAGING — DX DG SHOULDER 2+V*R*
2 series · 2 of 2 positions shown · non-contrast
Comparison: None.

CLINICAL DATA: Fell in yard

EXAM:
RIGHT SHOULDER - 2+ VIEW

[shoulder grashey]
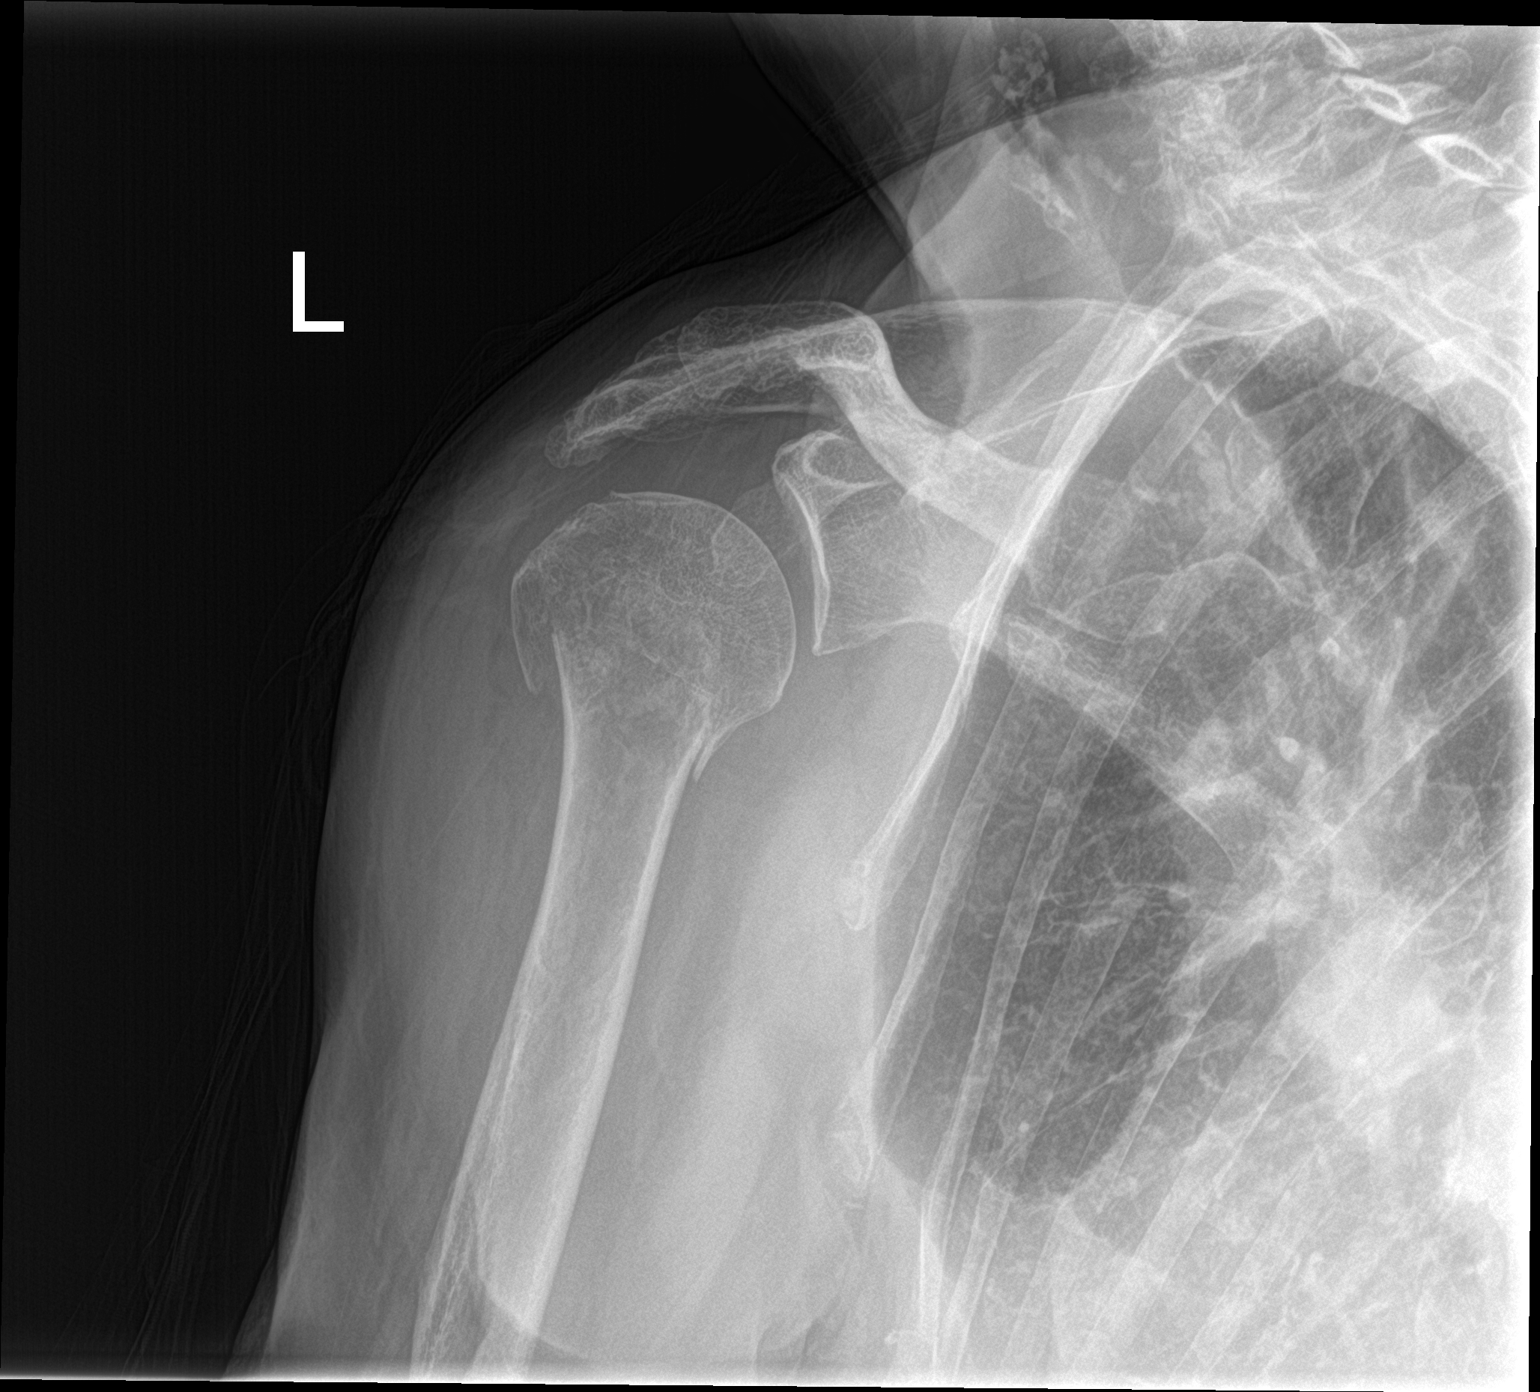

[shoulder y view]
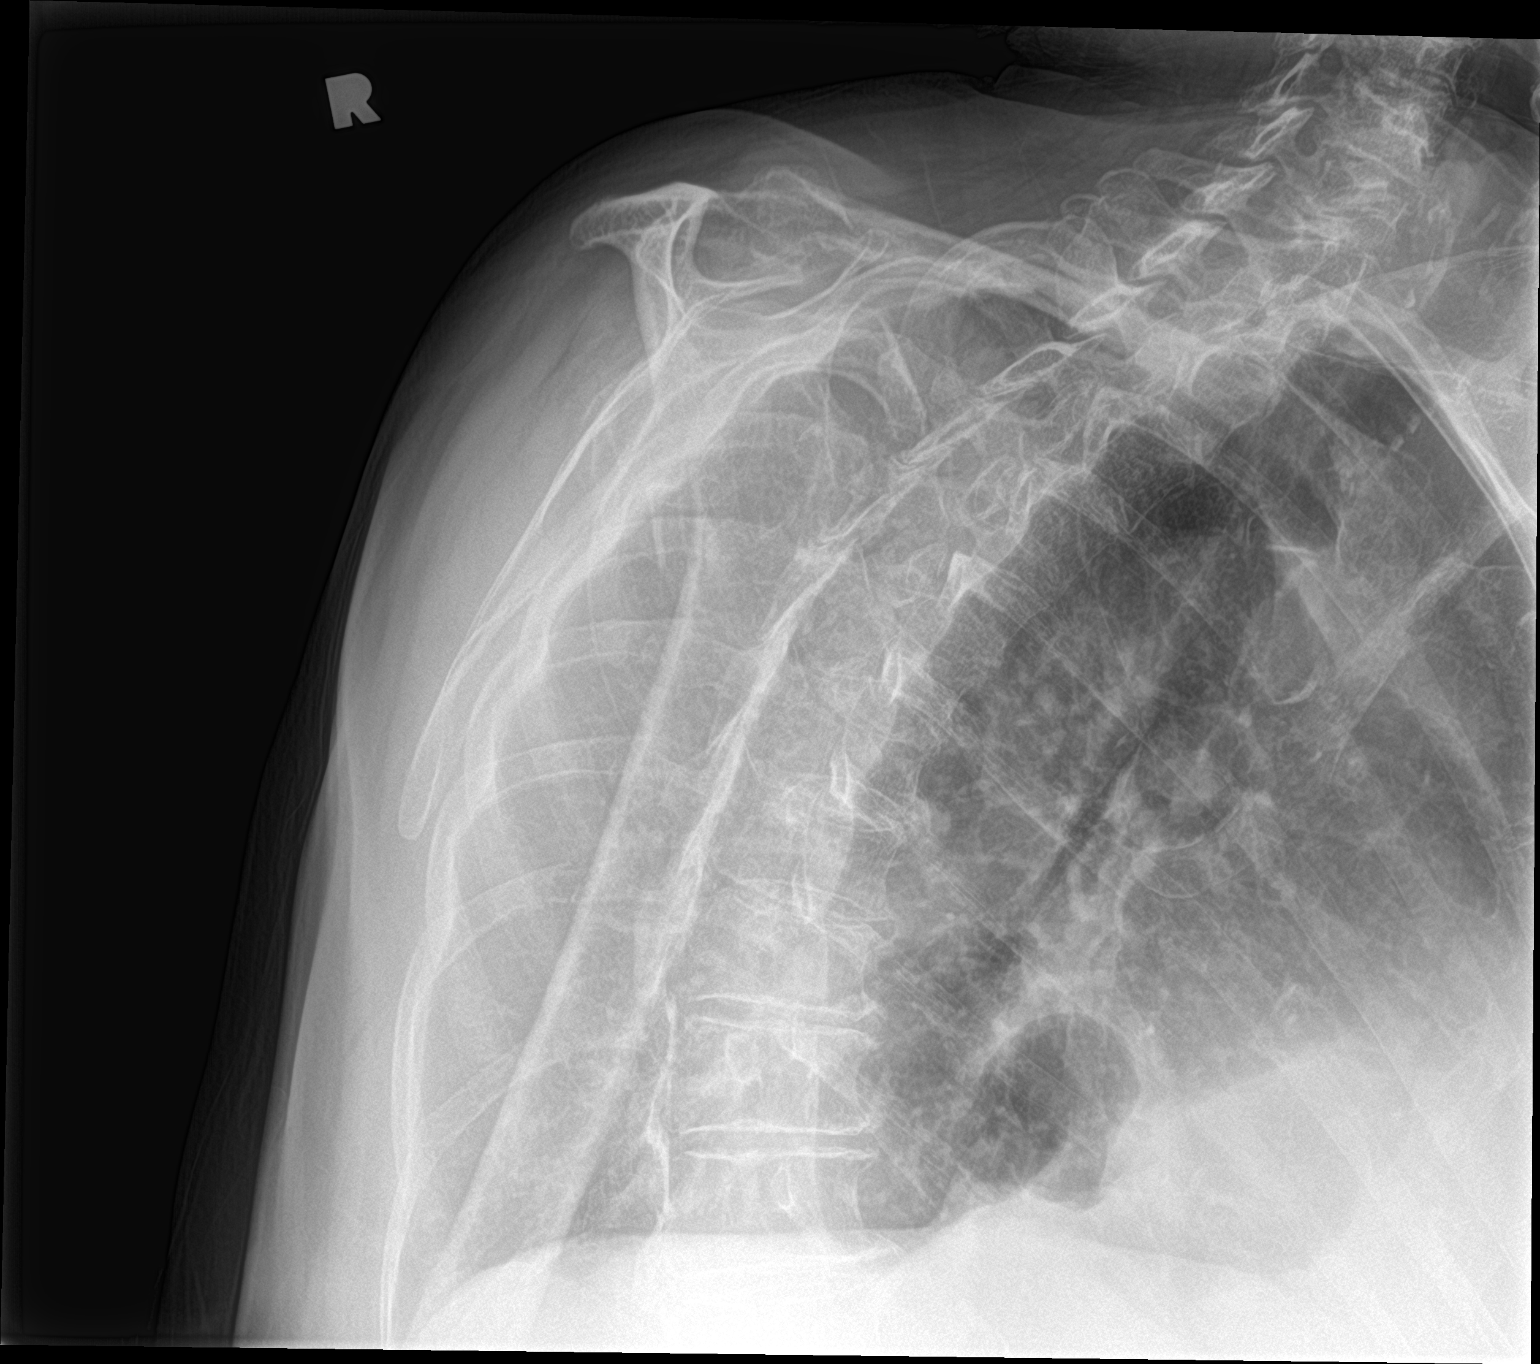

[2 of 2 positions shown; findings below may reference images not displayed]

FINDINGS: Acute mildly impacted right humeral neck fracture. Possible slight
anterior positioning of the humeral head with respect to the glenoid
fossa on Y-view. AC joint appears intact.
IMPRESSION: Acute mildly impacted right humeral neck fracture. Possible slight
anterior positioning of the humeral head with respect to the glenoid
fossa.

## 2021-04-11 IMAGING — DX DG CHEST 1V PORT
1 series · 1 of 1 positions shown · non-contrast
Comparison: Chest radiograph 07/31/2013.

CLINICAL DATA: Patient with decreased oxygen saturation.  Cough.

EXAM:
PORTABLE CHEST 1 VIEW

[chest ap]
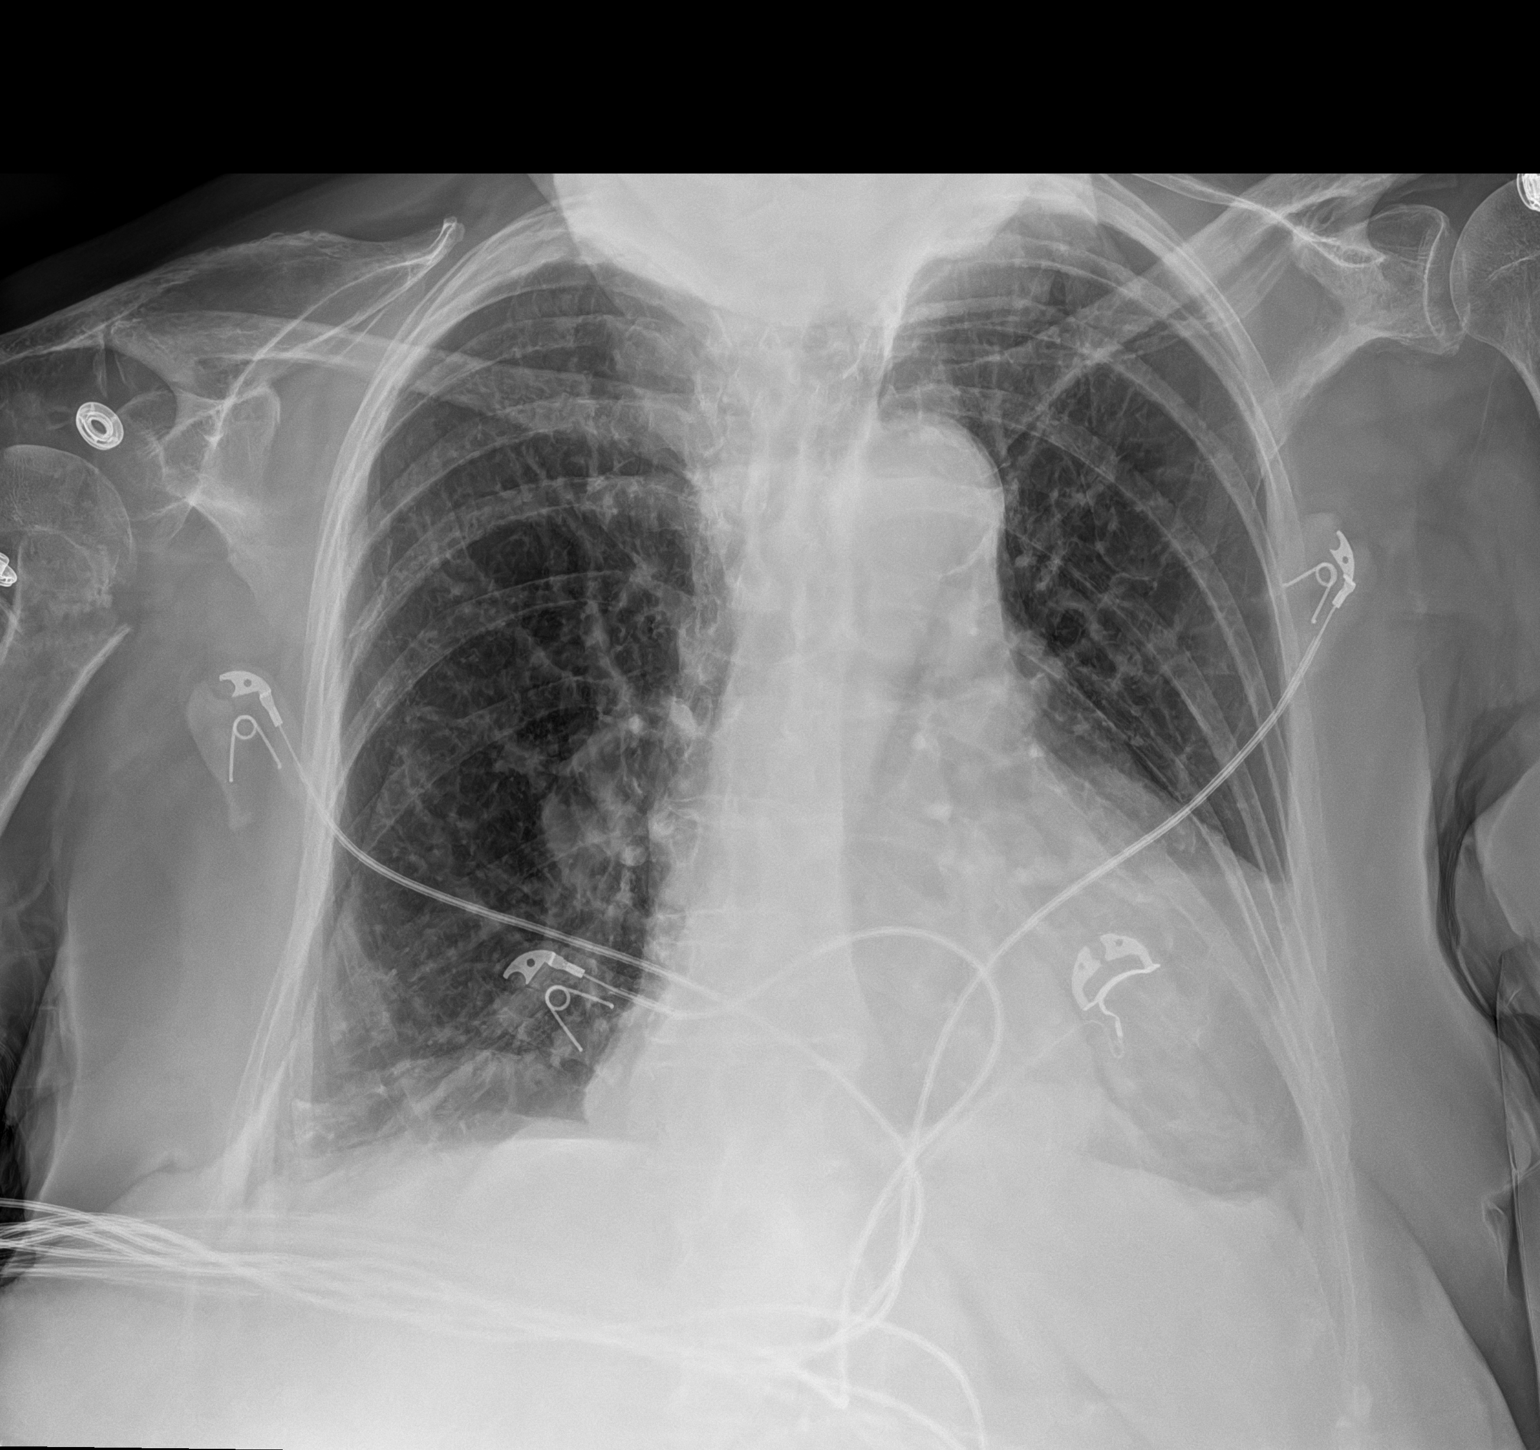

[1 of 1 positions shown; findings below may reference images not displayed]

FINDINGS: Monitoring leads overlie the patient. Stable cardiomegaly. Patchy
consolidative opacity within the left lower lobe. No pleural
effusion or pneumothorax. Incompletely visualized proximal right
humerus fracture. Thoracic spine degenerative changes.
IMPRESSION: 1. Patchy consolidation left lower lobe may represent atelectasis or
infection. Followup PA and lateral chest X-ray is recommended in 3-4
weeks following trial of antibiotic therapy to ensure resolution and
exclude underlying malignancy.
2. Redemonstrated proximal right humerus fracture.

## 2022-02-28 DEATH — deceased
